# Patient Record
Sex: Male | Born: 1975 | Race: Black or African American | Hispanic: No | Marital: Single | Smoking: Current every day smoker
Health system: Southern US, Community
[De-identification: ages and names within clinical notes are randomized; demographics above are authoritative.]

## PROBLEM LIST (undated history)

## (undated) DIAGNOSIS — M545 Low back pain, unspecified: Secondary | ICD-10-CM

## (undated) DIAGNOSIS — I10 Essential (primary) hypertension: Secondary | ICD-10-CM

## (undated) DIAGNOSIS — E119 Type 2 diabetes mellitus without complications: Secondary | ICD-10-CM

## (undated) DIAGNOSIS — R519 Headache, unspecified: Secondary | ICD-10-CM

## (undated) DIAGNOSIS — R51 Headache: Secondary | ICD-10-CM

## (undated) DIAGNOSIS — G8929 Other chronic pain: Secondary | ICD-10-CM

## (undated) DIAGNOSIS — T7840XA Allergy, unspecified, initial encounter: Secondary | ICD-10-CM

## (undated) DIAGNOSIS — M5430 Sciatica, unspecified side: Secondary | ICD-10-CM

## (undated) HISTORY — DX: Allergy, unspecified, initial encounter: T78.40XA

## (undated) HISTORY — PX: CHOLECYSTECTOMY: SHX55

---

## 2002-07-09 ENCOUNTER — Emergency Department (HOSPITAL_COMMUNITY): Admission: EM | Admit: 2002-07-09 | Discharge: 2002-07-10 | Payer: Self-pay | Admitting: Emergency Medicine

## 2004-09-02 ENCOUNTER — Ambulatory Visit (HOSPITAL_COMMUNITY): Admission: RE | Admit: 2004-09-02 | Discharge: 2004-09-02 | Payer: Self-pay | Admitting: Internal Medicine

## 2005-07-05 ENCOUNTER — Emergency Department (HOSPITAL_COMMUNITY): Admission: EM | Admit: 2005-07-05 | Discharge: 2005-07-05 | Payer: Self-pay | Admitting: Emergency Medicine

## 2005-07-11 ENCOUNTER — Emergency Department (HOSPITAL_COMMUNITY): Admission: EM | Admit: 2005-07-11 | Discharge: 2005-07-11 | Payer: Self-pay | Admitting: Emergency Medicine

## 2013-02-07 ENCOUNTER — Emergency Department (HOSPITAL_COMMUNITY)
Admission: EM | Admit: 2013-02-07 | Discharge: 2013-02-07 | Disposition: A | Discharge status: Home / Self Care | Payer: Self-pay | Attending: Emergency Medicine | Admitting: Emergency Medicine

## 2013-02-07 ENCOUNTER — Encounter (HOSPITAL_COMMUNITY): Payer: Self-pay | Admitting: Emergency Medicine

## 2013-02-07 DIAGNOSIS — F17200 Nicotine dependence, unspecified, uncomplicated: Secondary | ICD-10-CM | POA: Insufficient documentation

## 2013-02-07 DIAGNOSIS — H53149 Visual discomfort, unspecified: Secondary | ICD-10-CM | POA: Insufficient documentation

## 2013-02-07 DIAGNOSIS — R51 Headache: Secondary | ICD-10-CM | POA: Insufficient documentation

## 2013-02-07 MED ORDER — DIPHENHYDRAMINE HCL 50 MG/ML IJ SOLN
25.0000 mg | Freq: Once | INTRAMUSCULAR | Status: AC
  Administered 2013-02-07: 25 mg via INTRAMUSCULAR
  Filled 2013-02-07: qty 1

## 2013-02-07 MED ORDER — METOCLOPRAMIDE HCL 5 MG/ML IJ SOLN
10.0000 mg | Freq: Once | INTRAMUSCULAR | Status: AC
  Administered 2013-02-07: 10 mg via INTRAMUSCULAR
  Filled 2013-02-07: qty 2

## 2013-02-07 MED ORDER — KETOROLAC TROMETHAMINE 30 MG/ML IJ SOLN
30.0000 mg | Freq: Once | INTRAMUSCULAR | Status: AC
  Administered 2013-02-07: 30 mg via INTRAVENOUS
  Filled 2013-02-07: qty 1

## 2013-02-07 NOTE — ED Notes (Signed)
Pt states that he has had a headache off and on for several months now; pt states that his current headache has been consistent for approx 1 week;  Pt denies visual changes; pt c/o joint pain off and on as well; pt denies photophobia from headache

## 2013-02-07 NOTE — ED Provider Notes (Signed)
CSN: [id number]     Arrival date & time 02/07/13  0025 History   First MD Initiated Contact with Patient 02/07/13 0041     Chief Complaint  Patient presents with  . Migraine   (Consider location/radiation/quality/duration/timing/severity/associated sxs/prior Treatment) HPI Comments: Patient is a 37 y/o male with no significant PMH who presents for a frontal headache that has been sporadic over the past month. Patient states that pain is between his eyes and gradual in onset. It is associated with photophobia and relieved often times with PO tylenol. Patient endorses most recent headache beginning before bed this evening. He denies associated fever, difficulty speaking or swallowing, phonophobia, tinnitus, neck pain/stiffness, CP, SOB, numbness/tingling, and extremity weakness. Patient denies a hx of migraine headaches. Endorses increased stress at work and decreased sleep lately.  Patient is a 37 y.o. male presenting with migraines. The history is provided by the patient. No language interpreter was used.  Migraine Associated symptoms include headaches.    History reviewed. No pertinent past medical history. Past Surgical History  Procedure Laterality Date  . Cholecystectomy     No family history on file. History  Substance Use Topics  . Smoking status: Current Every Day Smoker -- 0.25 packs/day  . Smokeless tobacco: Not on file  . Alcohol Use: No    Review of Systems  Eyes: Positive for photophobia.  Neurological: Positive for headaches.  All other systems reviewed and are negative.    Allergies  Review of patient's allergies indicates no known allergies.  Home Medications   Current Outpatient Rx  Name  Route  Sig  Dispense  Refill  . acetaminophen (TYLENOL) 500 MG tablet   Oral   Take 1,000 mg by mouth every 6 (six) hours as needed for pain.          BP 124/84  Pulse 92  Temp(Src) 98 F (36.7 C) (Oral)  Resp 20  Ht 6' (1.829 m)  Wt 175 lb (79.379 kg)  BMI  23.73 kg/m2  SpO2 98%  Physical Exam  Nursing note and vitals reviewed. Constitutional: He is oriented to person, place, and time. He appears well-developed and well-nourished. No distress.  HENT:  Head: Normocephalic and atraumatic.  Mouth/Throat: Oropharynx is clear and moist. No oropharyngeal exudate.  Eyes: Conjunctivae and EOM are normal. No scleral icterus.  Neck: Normal range of motion. Neck supple.  No meningismus or nuchal rigidity.  Cardiovascular: Normal rate, regular rhythm and normal heart sounds.   Pulmonary/Chest: Effort normal and breath sounds normal. No respiratory distress. He has no wheezes. He has no rales.  Musculoskeletal: Normal range of motion.  Neurological: He is alert and oriented to person, place, and time. He has normal strength and normal reflexes. No cranial nerve deficit or sensory deficit. GCS eye subscore is 4. GCS verbal subscore is 5. GCS motor subscore is 6.  GCS 15. Patient speaks in focal oriented sentences. No focal neurologic deficits appreciated. Patient moves extremities without ataxia. Normal strength and sensation. DTRs normal and symmetric.  Skin: Skin is warm and dry. No rash noted. He is not diaphoretic. No erythema. No pallor.  Psychiatric: He has a normal mood and affect. His behavior is normal.    ED Course  Procedures (including critical care time) Labs Review Labs Reviewed - No data to display Imaging Review No results found.  EKG Interpretation   None       MDM   1. Headache    Patient presents for a headache which began before bed  this evening. He states he has been having headaches sporadically for the past month which are relieved by Tylenol. He states that he has been under marked stress lately and has been having them are regular sleep schedule. Symptoms associated only with photophobia. Patient well and nontoxic appearing today as well as hemodynamically stable and afebrile. Patient without meningismus or nuchal  rigidity and neuro exam is nonfocal. No history of head trauma or injury. Will treat patient with Toradol, Reglan, and Benadryl and reassess.  Patient endorses complete resolution with migraine cocktail. Suspect headache to be environmental in origin given lack of sleep and recent stress at home and work. Do not believe emergent imaging is indicated at this time. Patient stable and appropriate for discharge home with primary care followup as needed. Return precautions discussed and patient agreeable to plan with no unaddressed concerns.    Antony Madura, PA-C 02/07/13 [phone]

## 2013-02-07 NOTE — ED Provider Notes (Signed)
Medical screening examination/treatment/procedure(s) were performed by non-physician practitioner and as supervising physician I was immediately available for consultation/collaboration.   Markevion Lattin, MD 02/07/13 0514 

## 2013-05-29 ENCOUNTER — Emergency Department (HOSPITAL_COMMUNITY)
Admission: EM | Admit: 2013-05-29 | Discharge: 2013-05-30 | Disposition: A | Discharge status: Home / Self Care | Payer: Self-pay | Attending: Emergency Medicine | Admitting: Emergency Medicine

## 2013-05-29 ENCOUNTER — Encounter (HOSPITAL_COMMUNITY): Payer: Self-pay | Admitting: Emergency Medicine

## 2013-05-29 DIAGNOSIS — Z87891 Personal history of nicotine dependence: Secondary | ICD-10-CM | POA: Insufficient documentation

## 2013-05-29 DIAGNOSIS — K625 Hemorrhage of anus and rectum: Secondary | ICD-10-CM | POA: Insufficient documentation

## 2013-05-29 DIAGNOSIS — K5900 Constipation, unspecified: Secondary | ICD-10-CM | POA: Insufficient documentation

## 2013-05-29 DIAGNOSIS — R51 Headache: Secondary | ICD-10-CM | POA: Insufficient documentation

## 2013-05-29 DIAGNOSIS — J310 Chronic rhinitis: Secondary | ICD-10-CM | POA: Insufficient documentation

## 2013-05-29 DIAGNOSIS — R5381 Other malaise: Secondary | ICD-10-CM | POA: Insufficient documentation

## 2013-05-29 DIAGNOSIS — H53149 Visual discomfort, unspecified: Secondary | ICD-10-CM | POA: Insufficient documentation

## 2013-05-29 DIAGNOSIS — J3489 Other specified disorders of nose and nasal sinuses: Secondary | ICD-10-CM | POA: Insufficient documentation

## 2013-05-29 DIAGNOSIS — R5383 Other fatigue: Secondary | ICD-10-CM

## 2013-05-29 DIAGNOSIS — R739 Hyperglycemia, unspecified: Secondary | ICD-10-CM

## 2013-05-29 DIAGNOSIS — R519 Headache, unspecified: Secondary | ICD-10-CM

## 2013-05-29 DIAGNOSIS — R7309 Other abnormal glucose: Secondary | ICD-10-CM | POA: Insufficient documentation

## 2013-05-29 DIAGNOSIS — R209 Unspecified disturbances of skin sensation: Secondary | ICD-10-CM | POA: Insufficient documentation

## 2013-05-29 LAB — CBC WITH DIFFERENTIAL/PLATELET
Basophils Absolute: 0 K/uL (ref 0.0–0.1)
Basophils Relative: 0 % (ref 0–1)
Eosinophils Absolute: 1.5 K/uL — ABNORMAL HIGH (ref 0.0–0.7)
Eosinophils Relative: 13 % — ABNORMAL HIGH (ref 0–5)
HCT: 42.2 % (ref 39.0–52.0)
Hemoglobin: 14.2 g/dL (ref 13.0–17.0)
Lymphocytes Relative: 30 % (ref 12–46)
Lymphs Abs: 3.5 K/uL (ref 0.7–4.0)
MCH: 26.4 pg (ref 26.0–34.0)
MCHC: 33.6 g/dL (ref 30.0–36.0)
MCV: 78.6 fL (ref 78.0–100.0)
Monocytes Absolute: 0.9 10*3/uL (ref 0.1–1.0)
Monocytes Relative: 8 % (ref 3–12)
Neutro Abs: 5.8 10*3/uL (ref 1.7–7.7)
Neutrophils Relative %: 49 % (ref 43–77)
Platelets: 364 K/uL (ref 150–400)
RBC: 5.37 MIL/uL (ref 4.22–5.81)
RDW: 13.5 % (ref 11.5–15.5)
WBC: 11.7 10*3/uL — ABNORMAL HIGH (ref 4.0–10.5)

## 2013-05-29 LAB — BASIC METABOLIC PANEL
BUN: 13 mg/dL (ref 6–23)
CO2: 28 mEq/L (ref 19–32)
Calcium: 10.5 mg/dL (ref 8.4–10.5)
Chloride: 92 mEq/L — ABNORMAL LOW (ref 96–112)
Creatinine, Ser: 0.69 mg/dL (ref 0.50–1.35)
GFR calc non Af Amer: >90 mL/min (ref >90)
Glucose, Bld: 232 mg/dL — ABNORMAL HIGH (ref 70–99)
Potassium: 4.1 mEq/L (ref 3.7–5.3)
Sodium: 134 mEq/L — ABNORMAL LOW (ref 137–147)

## 2013-05-29 LAB — CBG MONITORING, ED: Glucose-Capillary: 117 mg/dL — ABNORMAL HIGH (ref 70–99)

## 2013-05-29 LAB — BASIC METABOLIC PANEL WITH GFR: GFR calc Af Amer: >90 mL/min (ref >90)

## 2013-05-29 LAB — LIPASE, BLOOD: LIPASE: 28 U/L (ref 11–59)

## 2013-05-29 MED ORDER — MOMETASONE FUROATE 50 MCG/ACT NA SUSP: 2.0000 | Freq: Every day | NASAL | Status: DC

## 2013-05-29 MED ORDER — DIPHENHYDRAMINE HCL 50 MG/ML IJ SOLN
12.5000 mg | Freq: Once | INTRAMUSCULAR | Status: AC
  Administered 2013-05-29: 12.5 mg via INTRAVENOUS
  Filled 2013-05-29: qty 1

## 2013-05-29 MED ORDER — POLYETHYLENE GLYCOL 3350 17 GM/SCOOP PO POWD: 17.0000 g | Freq: Two times a day (BID) | ORAL | Status: DC

## 2013-05-29 MED ORDER — SODIUM CHLORIDE 0.9 % IV BOLUS (SEPSIS)
1000.0000 mL | Freq: Once | INTRAVENOUS | Status: AC
  Administered 2013-05-29: 1000 mL via INTRAVENOUS

## 2013-05-29 MED ORDER — KETOROLAC TROMETHAMINE 15 MG/ML IJ SOLN
30.0000 mg | Freq: Once | INTRAMUSCULAR | Status: AC
  Administered 2013-05-29: 30 mg via INTRAVENOUS
  Filled 2013-05-29: qty 2

## 2013-05-29 MED ORDER — METOCLOPRAMIDE HCL 5 MG/ML IJ SOLN
10.0000 mg | Freq: Once | INTRAMUSCULAR | Status: AC
  Administered 2013-05-29: 10 mg via INTRAVENOUS
  Filled 2013-05-29: qty 2

## 2013-05-29 MED ORDER — DEXAMETHASONE SODIUM PHOSPHATE 10 MG/ML IJ SOLN
10.0000 mg | Freq: Once | INTRAMUSCULAR | Status: AC
  Administered 2013-05-29: 10 mg via INTRAVENOUS
  Filled 2013-05-29: qty 1

## 2013-05-29 NOTE — ED Notes (Signed)
Pt presents with c/o headache that started a week ago. Pt says he has had the headache off and on for the past week, has been taking over the counter medication as needed. Pt reports light sensitivity. Also c/o constipation and hard formed stools when he has a bowel movement, experiencing some abdominal pain with it as well.

## 2013-05-29 NOTE — ED Notes (Signed)
Pt c/o pain to face just above nose. Pt states his pain gets worse when he has a BM and then his legs get weak also. Pt states he has had some nausea and just does not feel comfortable right now. Pt a/o x 4.

## 2013-05-29 NOTE — Discharge Instructions (Signed)
Follow up with [facility] for follow up on your hyperglycemia. Take miralax as directed for your constipation. Use Nasonex nasal spray as directed for your Rhinitis. Recommend OTC antihistamines (zyrtec) for additional allergy related symptoms (sneezing, runny nose, itchy/watery eyes). Resource guide below for follow up reference.    Emergency Department Resource Guide 1) Find a Doctor and Pay Out of Pocket Although you won't have to find out who is covered by your insurance plan, it is a good idea to ask around and get recommendations. You will then need to call the office and see if the doctor you have chosen will accept you as a new patient and what types of options they offer for patients who are self-pay. Some doctors offer discounts or will set up payment plans for their patients who do not have insurance, but you will need to ask so you aren't surprised when you get to your appointment.  2) Contact Your Local Health Department Not all health departments have doctors that can see patients for sick visits, but many do, so it is worth a call to see if yours does. If you don't know where your local health department is, you can check in your phone book. The CDC also has a tool to help you locate your state's health department, and many state websites also have listings of all of their local health departments.  3) Find a Walk-in Clinic If your illness is not likely to be very severe or complicated, you may want to try a walk in clinic. These are popping up all over the country in pharmacies, drugstores, and shopping centers. They're usually staffed by nurse practitioners or physician assistants that have been trained to treat common illnesses and complaints. They're usually fairly quick and inexpensive. However, if you have serious medical issues or chronic medical problems, these are probably not your best option.  No Primary Care Doctor: - Call Health Connect at   [phone] - they can help you locate a primary care doctor that  accepts your insurance, provides certain services, etc. - Physician Referral Service- [phone]  Chronic Pain Problems: Organization         Address  Phone   Notes  [facility]  [phone] Patients need to be referred by their primary care doctor.   Medication Assistance: Organization         Address  Phone   Notes  [facility] Medication [facility] [address] [place], [place] [address] [phone] --Must be a resident of [facility] -- Must have NO insurance coverage whatsoever (no Medicaid/ Medicare, etc.) -- The pt. MUST have a primary care doctor that directs their care regularly and follows them in the community   Med[facility]ist  [phone]   [employer]  [phone]    Agencies that provide inexpensive medical care: Organization         Address  Phone   Notes  [facility]  [phone]   [facility]    [phone]   [facility] [address] [place], [place] [address] [phone]   [facility] [address] [place], [place] [phone]   Planned Parenthood    [phone]   [facility]    [phone]   [facility] and [facility]  [address], [place] Phone:  [phone], Fax:  [phone] Hours of  Operation:  9 am - 6 pm, M-F.  Also accepts Medicaid/Medicare and self-pay.  [facility]  [address], [place] Phone: [phone], Fax: [phone]. Hours of Operation:  8:30 am - 5:30 pm, M-F.  Also accepts Medicaid and self-pay.  [facility] [place] [address], [place] Phone: [phone]   [facility] [address], [place], [place] [phone], Ext. [phone] Mondays & Thursdays: 7-9 AM.  First 15 patients are seen on a first come, first serve basis.     Medicaid-accepting [facility] Providers:  Organization         Address  Phone   Notes  [facility] [address], [place] [phone] Also accepts self-pay patients.  [facility] [address], [place]  [phone]) [phone]   [facility] [address], [place] [phone]) [phone]   [facility] [address] [place] [address], [place] [phone]) [phone]   Renaye RakersVeita Bland [address], [place]   [phone] Only accepts [place] Access [place]icaid patients after they have their name applied to their card.   Self-Pay (no insurance) in [facility]:  Organization         Address  Phone   Notes  Sickle Cell Patients, [facility] [address], [place] [phone]) [phone]   [facility] [address], [place] [phone]) [phone]   [facility]  [address], [place] [phone]   [facility]/Dr. Osei-Bonsu  [address] [place] [address], [place] or [phone], [place] [phone]) [phone] Phone number for both [place] and [place] locations is the same.  [facility] [address], [place] [phone]) [phone]   [facility] [address] [place] [address], [place] or [address] [phone]   [facility] [address], [place] [phone], phone; [phone](336) [phone], fax Sees patients 1st and 3rd Saturday of every month.  Must not qualify for public or private insurance (i.e. Medicaid, Medicare, [place] Health Choice, Veterans' Benefits)  Household income should be no more than 200% of the poverty level The clinic cannot treat you if you are pregnant or think you are pregnant  Sexually transmitted diseases are not treated at the clinic.    Dental Care: Organization         Address  Phone  Notes  [facility]  Department of [facility] [address], [place] [phone] Accepts children up to age 36 who are enrolled in [place]icaid or [place] Health Choice; pregnant women with a Medicaid card; and children who have applied for Medicaid or [place] Health Choice, but were declined, whose parents can pay a reduced fee at time of service.  [facility] Department of [facility] [place]  [address], [place] [phone] Accepts children up to age 36 who are enrolled in [place]icaid or [place] Health Choice; pregnant women with a Medicaid card; and children who have applied for Medicaid or [employer] Health Choice, but were declined, whose parents can pay a reduced fee at time of service.  [facility]  [address], [place] [phone]) [phone] Patients are seen by appointment only. Walk-ins are not accepted. [facility] will see patients  36 years of age and older. Monday - Tuesday (8am-5pm) Most Wednesdays (8:30-5pm) $30 per visit, cash only  [facility]  [address], [facility] [place] [phone]) [phone] Patients are seen by appointment only. Walk-ins are not accepted. [facility] will see patients 36 years of age and older. One Wednesday Evening (Monthly: Volunteer Based).  $30 per visit, cash only  [employer] [facility]  [phone] for adults; Children under age 764, call Graduate Pediatric Dentistry at [phone]. Children aged 874-14, please call [phone] to request a pediatric application.  Dental services are provided in all areas of dental care including fillings, crowns and bridges, complete and partial dentures, implants, gum treatment, root canals, and extractions. Preventive care is also provided. Treatment is provided to both adults and children. Patients are selected via a lottery and there is often a waiting list.   [facility] [address], [place]  [phone]  [email or web address]   [facility] [address], [place], [place] [phone], Ext. [phone] Second and Fourth Thursday of each month, opens at 6:30 AM; Clinic ends at 9 AM.  Patients are seen on a first-come first-served basis, and a limited number are seen during each clinic.   [facility]  [address], [place], [place] [phone]) [phone]   Eligibility Requirements You must have lived in [place], [place], or [place] counties for at least the last three months.   You cannot be eligible for state or federal sponsored National Cityhealthcare insurance, including CIGNAVeterans Administration, [place]icaid, or [employer]Medicare.   You generally cannot be eligible for healthcare insurance through your employer.    How to apply: Eligibility screenings are held every Tuesday and Wednesday afternoon from 1:00 pm until 4:00 pm. You do not need an appointment for the interview!  [facility] [address], [place], [place] [phone]   [facility]  [phone]   [facility]  [phone]   [facility]  [phone]    Behavioral Health Resources in the Community: Intensive Outpatient Programs Organization         Address  Phone  Notes  [facility] [address], [place], [place] [phone]   [facility] [address], [place], [place] [phone]   [facility] [address], [place], [place]  [phone]   [facility] [address],  [place], [place] [phone] or [phone]   Substance Abuse Resources Organization         Address  Phone  Notes  Al[facility] Drug Services  [phone]   [facility]  [phone]   [facility]  [phone]   [facility]  [phone]   Residential & Outpatient Substance Abuse Program  [phone]   Psychological Services Organization          Address  Phone  Notes  [facility]  [phone]- [phone]   [facility]  [phone]- [phone]   [facility] [address], [place] [phone] or [phone]    Mobile Crisis Teams Organization         Address  Phone  Notes  [facility], Mobile Crisis Care Unit  [phone]   [facility]  [address] [place], [place] [phone]   [facility] [address] [place] [phone]    Self-Help/Support Groups Organization  Address  Phone             Notes  [place]. of [place] - variety of support groups  [place] Call for more information  Narcotics Anonymous (NA), [facility] [address], [employer] Ruth  2 meetings at this location   Special educational needs teacher         Address  Phone  Notes  ASAP Residential Treatment [place],    [place]  [phone]   [facility]  [address] [place] [address]1638, [place], [place]   [place], [place] [phone] Admissions: 8am-3pm M-F  Incentives Substance [place] [address]    [place], [place] [phone]   [facility] [address] [place], [place], [place]   [facility] [address]  [place], [place]   Insight Programs - Intensive Outpatient [address], [place], [place]   [facility] ([address].,  [place], [place] [phone] or [phone]   [facility] (RTS) [address] [place], [place] Accepts Medicaid  [facility] [address],  [place] [phone] Substance Abuse/Addiction Treatment   [facility] Organization         Address  Phone  Notes  [facility]  [phone]   Domenic Schwab, PhD [address], [place]   [phone] or [phone]   [facility] [place] [facility], [place] [phone]   [facility] [address], [place], [place] [phone] Insurance/Medicaid/sponsorship through [facility] and Families [address], [address]                                    [place], [place] [phone] [place] [address], [place] [phone]    Dr. Adele Schilder  [phone]   [facility] Dept. 1) [address] [place] 2) [place] 3)  [place] [address], [place] [phone]  [phone]   [place] [phone] or [phone] (After Hours)

## 2013-05-29 NOTE — ED Provider Notes (Signed)
CSN: [id number]     Arrival date & time 05/29/13  1432 History   First MD Initiated Contact with Patient 05/29/13 1915     Chief Complaint  Patient presents with  . Headache  . Abdominal Pain     (Consider location/radiation/quality/duration/timing/severity/associated sxs/prior Treatment) Patient is a 38 y.o. male presenting with headaches and abdominal pain.  Headache Associated symptoms: abdominal pain, congestion and sinus pressure   Abdominal Pain  38 yo male presents with HA and RLQ pain that started 1 week ago. RLQ abdominal pain is rated at 5/10 and is constant. Pain worsened by eating. Pain improved with bowel movements. HA is intermittent and lasts 2-3 hours at a time. HA usually worse in the morning and the evening. Pain improved with tylenol but only a couple of hours and then HA returns. Pain rated at 9/10.Pain localized between the eyes.  Patient admits to similar episode that happened 3 months ago. Otherwise patient denies hx of HAs. Denies any family hx of migraines. HA worsened "2-3 hours after I go to the bathroom" (bowel movements). Admits to photophobia and phonophobia.  Patient admits to dizziness when he doesn't eat but resolves when he eats.  Denies fever/chills, N/V/D, denies any urinary symptoms. Admits to occasional dark black stools, constipation, and weakness. Patient states he experiences lower leg "weakness" and "tingling" about 2 hours after his BMs.    History reviewed. No pertinent past medical history. Past Surgical History  Procedure Laterality Date  . Cholecystectomy     No family history on file. History  Substance Use Topics  . Smoking status: Former Smoker -- 0.25 packs/day  . Smokeless tobacco: Not on file  . Alcohol Use: No    Review of Systems  HENT: Positive for congestion, sinus pressure and sneezing.   Gastrointestinal: Positive for abdominal pain and anal bleeding.  All other systems reviewed and are negative.      Allergies  Review  of patient's allergies indicates no known allergies.  Home Medications   Current Outpatient Rx  Name  Route  Sig  Dispense  Refill  . acetaminophen (TYLENOL) 500 MG tablet   Oral   Take 1,000 mg by mouth every 6 (six) hours as needed (pain).          . Ibuprofen-Diphenhydramine HCl (ADVIL PM) 200-25 MG CAPS   Oral   Take 1 capsule by mouth at bedtime as needed (flu-like symptoms).         . mometasone (NASONEX) 50 MCG/ACT nasal spray   Nasal   Place 2 sprays into the nose daily.   17 g   1   . polyethylene glycol powder (GLYCOLAX/MIRALAX) powder   Oral   Take 17 g by mouth 2 (two) times daily. Until daily soft stools  OTC   255 g   0    BP 123/78  Pulse 89  Temp(Src) 98 F (36.7 C) (Oral)  Resp 16  SpO2 99% Physical Exam  Nursing note and vitals reviewed. Constitutional: He is oriented to person, place, and time. He appears well-developed and well-nourished. No distress.  HENT:  Head: Normocephalic and atraumatic.  Right Ear: Tympanic membrane and ear canal normal.  Left Ear: Tympanic membrane and ear canal normal.  Nose: Mucosal edema and rhinorrhea present. Right sinus exhibits frontal sinus tenderness. Right sinus exhibits no maxillary sinus tenderness. Left sinus exhibits frontal sinus tenderness. Left sinus exhibits no maxillary sinus tenderness.  Mouth/Throat: Uvula is midline, oropharynx is clear and moist and mucous membranes  are normal.  Eyes: Conjunctivae and EOM are normal. Pupils are equal, round, and reactive to light. No scleral icterus.  Neck: Trachea normal and phonation normal. Neck supple. No JVD present. Carotid bruit is not present. No tracheal deviation present.  Cardiovascular: Normal rate and regular rhythm.  Exam reveals no gallop and no friction rub.   No murmur heard. Pulmonary/Chest: Effort normal and breath sounds normal. No respiratory distress. He has no wheezes. He has no rhonchi. He has no rales.  Abdominal: Soft. Bowel sounds are  normal. He exhibits no distension. There is no hepatosplenomegaly. There is tenderness in the right lower quadrant and left lower quadrant. There is no rigidity, no rebound, no guarding, no tenderness at McBurney's point and negative Murphy's sign.  Genitourinary: Rectum normal. Rectal exam shows no external hemorrhoid, no internal hemorrhoid, no fissure, no mass, no tenderness and anal tone normal. Guaiac negative stool.  Musculoskeletal: Normal range of motion. He exhibits no edema.  Neurological: He is alert and oriented to person, place, and time. He has normal strength. No cranial nerve deficit or sensory deficit.  CN II-XII grossly intact. Cerebellar function appears intact with finger to nose.   Skin: Skin is warm and dry. He is not diaphoretic.  Psychiatric: He has a normal mood and affect. His behavior is normal.    ED Course  Procedures (including critical care time) Labs Review Labs Reviewed  CBC WITH DIFFERENTIAL - Abnormal; Notable for the following:    WBC 11.7 (*)    Eosinophils Relative 13 (*)    Eosinophils Absolute 1.5 (*)    All other components within normal limits  BASIC METABOLIC PANEL - Abnormal; Notable for the following:    Sodium 134 (*)    Chloride 92 (*)    Glucose, Bld 232 (*)    All other components within normal limits  CBG MONITORING, ED - Abnormal; Notable for the following:    Glucose-Capillary 117 (*)    All other components within normal limits  LIPASE, BLOOD   Imaging Review No results found.  EKG Interpretation   None       MDM   Final diagnoses:  Headache  Sinus pressure  Hyperglycemia  Constipation  Rhinitis    Patient afebrile.  Mild Leukocytosis at 11.7 with eosinophilia. Suspect allergies given patient congestion, Rhinitis and sinus tenderness.  Hyperglycemia at 232. Improved with IV fluids to 117 Lipase negative Hemoccult negative Patient's HA and abdominal pain resolved with Tx in ED. Reexamination  reveals soft  nontender abdomen.   Discussed labs, and exam findings with patient. Advised follow up with PCP for further evaluation of Hyperglycemia. Patient provided resource guide for reference. Recommend return to ED should symptoms worsen. Patient agrees with plan. Discharged in good condition.   Meds given in ED:  Medications  sodium chloride 0.9 % bolus 1,000 mL (0 mLs Intravenous Stopped 05/29/13 2313)  diphenhydrAMINE (BENADRYL) injection 12.5 mg (12.5 mg Intravenous Given 05/29/13 2131)  metoCLOPramide (REGLAN) injection 10 mg (10 mg Intravenous Given 05/29/13 2126)  ketorolac (TORADOL) 15 MG/ML injection 30 mg (30 mg Intravenous Given 05/29/13 2129)  dexamethasone (DECADRON) injection 10 mg (10 mg Intravenous Given 05/29/13 2312)    Discharge Medication List as of 05/29/2013 11:07 PM        Rudene Anda, PA-C 05/30/13 1302

## 2013-05-29 NOTE — ED Notes (Signed)
Tried to get a urine sample from the pt, he stated that he didn't have to use the restroom. Will ask again later

## 2013-06-02 NOTE — ED Provider Notes (Signed)
Medical screening examination/treatment/procedure(s) were performed by non-physician practitioner and as supervising physician I was immediately available for consultation/collaboration.  EKG Interpretation  None    Gavin PoundMichael Y. Breeonna Mone, MD 06/02/13 1044

## 2013-06-28 ENCOUNTER — Ambulatory Visit: Payer: Self-pay | Admitting: Physician Assistant

## 2013-06-28 VITALS — BP 126/78 | HR 102 | Temp 97.7°F | Resp 18 | Ht 72.0 in | Wt 184.8 lb

## 2013-06-28 DIAGNOSIS — J329 Chronic sinusitis, unspecified: Secondary | ICD-10-CM

## 2013-06-28 DIAGNOSIS — R51 Headache: Secondary | ICD-10-CM

## 2013-06-28 DIAGNOSIS — Z131 Encounter for screening for diabetes mellitus: Secondary | ICD-10-CM

## 2013-06-28 DIAGNOSIS — E119 Type 2 diabetes mellitus without complications: Secondary | ICD-10-CM

## 2013-06-28 LAB — POCT GLYCOSYLATED HEMOGLOBIN (HGB A1C): Hemoglobin A1C: 9.6

## 2013-06-28 LAB — GLUCOSE, POCT (MANUAL RESULT ENTRY): POC Glucose: 264 mg/dl — AB (ref 70–99)

## 2013-06-28 MED ORDER — METFORMIN HCL 500 MG PO TABS: 500.0000 mg | ORAL_TABLET | Freq: Every day | ORAL | Status: DC

## 2013-06-28 MED ORDER — FLUTICASONE PROPIONATE 50 MCG/ACT NA SUSP: 2.0000 | Freq: Every day | NASAL | Status: DC

## 2013-06-28 MED ORDER — IBUPROFEN 600 MG PO TABS: 600.0000 mg | ORAL_TABLET | Freq: Three times a day (TID) | ORAL | Status: DC | PRN

## 2013-06-28 MED ORDER — AMOXICILLIN-POT CLAVULANATE 875-125 MG PO TABS: 1.0000 | ORAL_TABLET | Freq: Two times a day (BID) | ORAL | Status: DC

## 2013-06-28 MED ORDER — KETOROLAC TROMETHAMINE 60 MG/2ML IM SOLN
60.0000 mg | Freq: Once | INTRAMUSCULAR | Status: AC
  Administered 2013-06-28: 60 mg via INTRAMUSCULAR

## 2013-06-28 NOTE — Progress Notes (Signed)
Subjective:    Patient ID: Ian Moreno, male    DOB: 11/19/1975, 38 y.o.   MRN: [id number]  HPI   Ian Moreno is a pleasant 38 yr old male here with concern for "sinus."  This has been present off and on x 1 month.  He has a frontal HA that is bilateral, sometimes band-like.  He has taken tylenol for this.  He was seen for this at the ED about 1 month ago, had improvement with Toradol and steroids.  He has Flonase but uses it inconsistently.  He has not had fever.  He occasionally feels dizzy.  No visual change.  He denies sneezing or runny nose.  Additionally he has concern for diabetes.  Someone has mentioned to him before that he should only have diet drinks because of his sugar.  On chart review, glucose elevated at ED visit 1 month ago.  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  He has gained 15 lbs since stopped smoking.   Review of Systems  Constitutional: Negative for fever and chills.  HENT: Positive for congestion and sinus pressure. Negative for rhinorrhea and sneezing.   Respiratory: Negative for cough, shortness of breath and wheezing.   Cardiovascular: Negative.   Gastrointestinal: Negative.   Endocrine: Negative for polydipsia, polyphagia and polyuria.  Musculoskeletal: Negative.   Skin: Negative.   Neurological: Positive for headaches.       Objective:   Physical Exam  Vitals reviewed. Constitutional: He is oriented to person, place, and time. He appears well-developed and well-nourished. No distress.  HENT:  Head: Normocephalic and atraumatic.  Right Ear: Tympanic membrane and ear canal normal.  Left Ear: Tympanic membrane and ear canal normal.  Nose: Right sinus exhibits frontal sinus tenderness. Right sinus exhibits no maxillary sinus tenderness. Left sinus exhibits frontal sinus tenderness. Left sinus exhibits no maxillary sinus tenderness.  Mouth/Throat: Uvula is midline, oropharynx is clear and moist and mucous membranes are normal.    Eyes: Conjunctivae are normal. No scleral icterus.  Neck: Neck supple.  Cardiovascular: Normal rate, regular rhythm and normal heart sounds.   Pulmonary/Chest: Effort normal and breath sounds normal. He has no wheezes. He has no rales.  Lymphadenopathy:    He has no cervical adenopathy.  Neurological: He is alert and oriented to person, place, and time.  Skin: Skin is warm and dry.  Psychiatric: He has a normal mood and affect. His behavior is normal.    Results for orders placed in visit on 06/28/13  GLUCOSE, POCT (MANUAL RESULT ENTRY)      Result Value Ref Range   POC Glucose 264 (*) 70 - 99 mg/dl  POCT GLYCOSYLATED HEMOGLOBIN (HGB A1C)      Result Value Ref Range   Hemoglobin A1C 9.6         Assessment & Plan:  Sinusitis - Plan: fluticasone (FLONASE) 50 MCG/ACT nasal spray, amoxicillin-clavulanate (AUGMENTIN) 875-125 MG per tablet, DISCONTINUED: amoxicillin-clavulanate (AUGMENTIN) 875-125 MG per tablet  Headache(784.0) - Plan: POCT glucose (manual entry), POCT glycosylated hemoglobin (Hb A1C), ketorolac (TORADOL) injection 60 mg, ibuprofen (ADVIL,MOTRIN) 600 MG tablet, DISCONTINUED: ibuprofen (ADVIL,MOTRIN) 600 MG tablet  Diabetes - Plan: metFORMIN (GLUCOPHAGE) 500 MG tablet, DISCONTINUED: metFORMIN (GLUCOPHAGE) 500 MG tablet  Screening for diabetes mellitus - Plan: POCT glucose (manual entry), POCT glycosylated hemoglobin (Hb A1C)   Ian Moreno is a pleasant 38 yr old male here with headache and sinusitis.  Given the chronicity of his sinus  pressure and the tenderness on exam, will treat with augmentin x 10 days.  Instructed him to use Flonase daily rather than prn for best effect.  Toradol in clinic for HA with good relief of symptoms.  I have prescribed ibuprofen 600mg  for him to use prn HA.  I think that his current HA is in part due to tension type headache as well as sinusitis.  We discussed upper back/neck stretches, heat, etc to help ease tension.   Pt requested screening  for DM as he has been told his sugar was high in the past. Today random glucose is 264 and A1C is 9.6 - which meets criteria for DM diagnosis.  I discussed what this means with pt.  Will start metformin 500mg  QD today - encouraged to take with food.  We discussed the possibility of managing DM with lifestyle mods.  Encouraged him to avoid sugar sweetened beverages and printed him meal planning guides.  Additionally I encouraged him to work on incorporating exercise daily.  Will have him schedule ant appt in 2-3 wks for follow up and adjustment of meds if needed.  He understands and agrees and his questions were answered.    Loleta Dicker MHS, PA-C [facility] 3/25/20158:38 PM

## 2013-06-28 NOTE — Patient Instructions (Signed)
The antibiotic prescribed today is for your present infection only. It is very important to follow the directions for the medication prescribed. Antibiotics are generally given for a specified period of time (7-10 days, for example) to be taken at specific intervals (every 4, 6, 8 or 12 hours). This is necessary to keep the right amount of the medication in the bloodstream. Too much of the medication may cause an adverse reaction, too little may not be completely effective.  To clear your infection completely, continue taking the antibiotic for the full time of treatment, even if you begin to feel better after a few days.  If you miss a dose of the antibiotic, take it as soon as possible. Then go back to your regular dosing schedule. However, don't double up doses.    Begin taking the amoxicillin-clavulanate (AugmentIn) as directed.  This is the antibiotic that will treat you for a sinus infection  Use the fluticasone (Flonase) nasal spray once daily every day - this will help your sinus symptoms  Take the ibuprofen 600mg  if you need it for headache  I think some of your headache is caused by tension in your neck and shoulders, so I want you to use a heating pad on your neck and shoulders  Your blood sugar is elevated today and your A1C is also elevated meaning you having diabetes.  It is important that you take the metformin every day with breakfast.  This will help bring your blood sugar down to a healthy level.  Make sure you are not drinking soda, sweet tea, juice, sports or energy drinks as these have LOTs of sugar.  Make sure you are eating plenty of vegetables, fruits, and lean meats.    Make sure you are getting some exercise every day, even if it is just a walk  Please make an appointment in 2-3 weeks so we can recheck you and adjust your medicines if needed   Diabetes Meal Planning Guide The diabetes meal planning guide is a tool to help you plan your meals and snacks. It is important for  people with diabetes to manage their blood glucose (sugar) levels. Choosing the right foods and the right amounts throughout your day will help control your blood glucose. Eating right can even help you improve your blood pressure and reach or maintain a healthy weight. CARBOHYDRATE COUNTING MADE EASY When you eat carbohydrates, they turn to sugar. This raises your blood glucose level. Counting carbohydrates can help you control this level so you feel better. When you plan your meals by counting carbohydrates, you can have more flexibility in what you eat and balance your medicine with your food intake. Carbohydrate counting simply means adding up the total amount of carbohydrate grams in your meals and snacks. Try to eat about the same amount at each meal. Foods with carbohydrates are listed below. Each portion below is 1 carbohydrate serving or 15 grams of carbohydrates. Ask your dietician how many grams of carbohydrates you should eat at each meal or snack. Grains and Starches  1 slice bread.   English muffin or hotdog/hamburger bun.   cup cold cereal (unsweetened).   cup cooked pasta or rice.   cup starchy vegetables (corn, potatoes, peas, beans, winter squash).  1 tortilla (6 inches).   bagel.  1 waffle or pancake (size of a CD).   cup cooked cereal.  4 to 6 small crackers. *Whole grain is recommended. Fruit  1 cup fresh unsweetened berries, melon, papaya, pineapple.  1 small fresh fruit.   banana or mango.   cup fruit juice (4 oz unsweetened).   cup canned fruit in natural juice or water.  2 tbs dried fruit.  12 to 15 grapes or cherries. Milk and Yogurt  1 cup fat-free or 1% milk.  1 cup soy milk.  6 oz light yogurt with sugar-free sweetener.  6 oz low-fat soy yogurt.  6 oz plain yogurt. Vegetables  1 cup raw or  cup cooked is counted as 0 carbohydrates or a "free" food.  If you eat 3 or more servings at 1 meal, count them as 1 carbohydrate  serving. Other Carbohydrates   oz chips or pretzels.   cup ice cream or frozen yogurt.   cup sherbet or sorbet.  2 inch square cake, no frosting.  1 tbs honey, sugar, jam, jelly, or syrup.  2 small cookies.  3 squares of graham crackers.  3 cups popcorn.  6 crackers.  1 cup broth-based soup.  Count 1 cup casserole or other mixed foods as 2 carbohydrate servings.  Foods with less than 20 calories in a serving may be counted as 0 carbohydrates or a "free" food. You may want to purchase a book or computer software that lists the carbohydrate gram counts of different foods. In addition, the nutrition facts panel on the labels of the foods you eat are a good source of this information. The label will tell you how big the serving size is and the total number of carbohydrate grams you will be eating per serving. Divide this number by 15 to obtain the number of carbohydrate servings in a portion. Remember, 1 carbohydrate serving equals 15 grams of carbohydrate. SERVING SIZES Measuring foods and serving sizes helps you make sure you are getting the right amount of food. The list below tells how big or small some common serving sizes are.  1 oz.........4 stacked dice.  3 oz........Marland KitchenDeck of cards.  1 tsp.......Marland KitchenTip of little finger.  1 tbs......Marland KitchenMarland KitchenThumb.  2 tbs.......Marland KitchenGolf ball.   cup......Marland KitchenHalf of a fist.  1 cup.......Marland KitchenA fist. SAMPLE DIABETES MEAL PLAN Below is a sample meal plan that includes foods from the grain and starches, dairy, vegetable, fruit, and meat groups. A dietician can individualize a meal plan to fit your calorie needs and tell you the number of servings needed from each food group. However, controlling the total amount of carbohydrates in your meal or snack is more important than making sure you include all of the food groups at every meal. You may interchange carbohydrate containing foods (dairy, starches, and fruits). The meal plan below is an example of a  2000 calorie diet using carbohydrate counting. This meal plan has 17 carbohydrate servings. Breakfast  1 cup oatmeal (2 carb servings).   cup light yogurt (1 carb serving).  1 cup blueberries (1 carb serving).   cup almonds. Snack  1 large apple (2 carb servings).  1 low-fat string cheese stick. Lunch  Chicken breast salad.  1 cup spinach.   cup chopped tomatoes.  2 oz chicken breast, sliced.  2 tbs low-fat [place] dressing.  12 whole-wheat crackers (2 carb servings).  12 to 15 grapes (1 carb serving).  1 cup low-fat milk (1 carb serving). Snack  1 cup carrots.   cup hummus (1 carb serving). Dinner  3 oz broiled salmon.  1 cup brown rice (3 carb servings). Snack  1  cups steamed broccoli (1 carb serving) drizzled with 1 tsp olive oil and lemon juice.  1 cup light pudding (2 carb servings). DIABETES MEAL PLANNING WORKSHEET Your dietician can use this worksheet to help you decide how many servings of foods and what types of foods are right for you.  BREAKFAST Food Group and Servings / Carb Servings Grain/Starches __________________________________ Dairy __________________________________________ Vegetable ______________________________________ Fruit ___________________________________________ Meat __________________________________________ Fat ____________________________________________ LUNCH Food Group and Servings / Carb Servings Grain/Starches ___________________________________ Dairy ___________________________________________ Fruit ____________________________________________ Meat ___________________________________________ Fat _____________________________________________ Laural Golden Food Group and Servings / Carb Servings Grain/Starches ___________________________________ Dairy ___________________________________________ Fruit ____________________________________________ Meat ___________________________________________ Fat  _____________________________________________ SNACKS Food Group and Servings / Carb Servings Grain/Starches ___________________________________ Dairy ___________________________________________ Vegetable _______________________________________ Fruit ____________________________________________ Meat ___________________________________________ Fat _____________________________________________ DAILY TOTALS Starches _________________________ Vegetable ________________________ Fruit ____________________________ Dairy ____________________________ Meat ____________________________ Fat ______________________________ Document Released: 12/18/2004 Document Revised: 06/15/2011 Document Reviewed: 10/29/2008 [employer] Patient Information 2014 [employer]

## 2013-07-04 ENCOUNTER — Telehealth: Payer: Self-pay

## 2013-07-04 NOTE — Telephone Encounter (Signed)
Spoke to pt, he has been taking the metformin and amoxicillin at the same time in the morning, and then amoxicillin in the afternoon.  He stated he did not take the metformin today.   i advised him to take the metformin prescribed, wait a few hours and then take the first of two amoxicillin. i told him to call back and let us know which of the medications is causing nausea.

## 2013-07-04 NOTE — Telephone Encounter (Signed)
Patient was recently seen and reports that's taking amoxicillan makes him very dizzy and he can not work while on medication.  I offered for him to be seen but he wanted a telephone message instead.   Best: [phone]

## 2013-07-10 ENCOUNTER — Emergency Department (HOSPITAL_COMMUNITY)
Admission: EM | Admit: 2013-07-10 | Discharge: 2013-07-11 | Disposition: A | Discharge status: Home / Self Care | Payer: Self-pay | Attending: Emergency Medicine | Admitting: Emergency Medicine

## 2013-07-10 DIAGNOSIS — E119 Type 2 diabetes mellitus without complications: Secondary | ICD-10-CM | POA: Insufficient documentation

## 2013-07-10 DIAGNOSIS — Z79899 Other long term (current) drug therapy: Secondary | ICD-10-CM | POA: Insufficient documentation

## 2013-07-10 DIAGNOSIS — Z87891 Personal history of nicotine dependence: Secondary | ICD-10-CM | POA: Insufficient documentation

## 2013-07-10 DIAGNOSIS — R51 Headache: Secondary | ICD-10-CM | POA: Insufficient documentation

## 2013-07-10 DIAGNOSIS — IMO0002 Reserved for concepts with insufficient information to code with codable children: Secondary | ICD-10-CM | POA: Insufficient documentation

## 2013-07-10 DIAGNOSIS — I10 Essential (primary) hypertension: Secondary | ICD-10-CM | POA: Insufficient documentation

## 2013-07-10 HISTORY — DX: Essential (primary) hypertension: I10

## 2013-07-10 HISTORY — DX: Type 2 diabetes mellitus without complications: E11.9

## 2013-07-11 ENCOUNTER — Encounter (HOSPITAL_COMMUNITY): Payer: Self-pay | Admitting: Emergency Medicine

## 2013-07-11 LAB — CBG MONITORING, ED: Glucose-Capillary: 152 mg/dL — ABNORMAL HIGH (ref 70–99)

## 2013-07-11 NOTE — Discharge Instructions (Signed)
Arterial Hypertension °Arterial hypertension (high blood pressure) is a condition of elevated pressure in your blood vessels. Hypertension over a long period of time is a risk factor for strokes, heart attacks, and heart failure. It is also the leading cause of kidney (renal) failure.  °CAUSES  °· In Adults -- Over 90% of all hypertension has no known cause. This is called essential or primary hypertension. In the other 10% of people with hypertension, the increase in blood pressure is caused by another disorder. This is called secondary hypertension. Important causes of secondary hypertension are: °· Heavy alcohol use. °· Obstructive sleep apnea. °· Hyperaldosterosim (Conn's syndrome). °· Steroid use. °· Chronic kidney failure. °· Hyperparathyroidism. °· Medications. °· Renal artery stenosis. °· Pheochromocytoma. °· Cushing's disease. °· Coarctation of the aorta. °· Scleroderma renal crisis. °· Licorice (in excessive amounts). °· Drugs (cocaine, methamphetamine). °Your caregiver can explain any items above that apply to you. °· In Children -- Secondary hypertension is more common and should always be considered. °· Pregnancy -- Few women of childbearing age have high blood pressure. However, up to 10% of them develop hypertension of pregnancy. Generally, this will not harm the woman. It may be a sign of 3 complications of pregnancy: preeclampsia, HELLP syndrome, and eclampsia. Follow up and control with medication is necessary. °SYMPTOMS  °· This condition normally does not produce any noticeable symptoms. It is usually found during a routine exam. °· Malignant hypertension is a late problem of high blood pressure. It may have the following symptoms: °· Headaches. °· Blurred vision. °· End-organ damage (this means your kidneys, heart, lungs, and other organs are being damaged). °· Stressful situations can increase the blood pressure. If a person with normal blood pressure has their blood pressure go up while being  seen by their caregiver, this is often termed "white coat hypertension." Its importance is not known. It may be related with eventually developing hypertension or complications of hypertension. °· Hypertension is often confused with mental tension, stress, and anxiety. °DIAGNOSIS  °The diagnosis is made by 3 separate blood pressure measurements. They are taken at least 1 week apart from each other. If there is organ damage from hypertension, the diagnosis may be made without repeat measurements. °Hypertension is usually identified by having blood pressure readings: °· Above 140/90 mmHg measured in both arms, at 3 separate times, over a couple weeks. °· Over 130/80 mmHg should be considered a risk factor and may require treatment in patients with diabetes. °Blood pressure readings over 120/80 mmHg are called "pre-hypertension" even in non-diabetic patients. °To get a true blood pressure measurement, use the following guidelines. Be aware of the factors that can alter blood pressure readings. °· Take measurements at least 1 hour after caffeine. °· Take measurements 30 minutes after smoking and without any stress. This is another reason to quit smoking  it raises your blood pressure. °· Use a proper cuff size. Ask your caregiver if you are not sure about your cuff size. °· Most home blood pressure cuffs are automatic. They will measure systolic and diastolic pressures. The systolic pressure is the pressure reading at the start of sounds. Diastolic pressure is the pressure at which the sounds disappear. If you are elderly, measure pressures in multiple postures. Try sitting, lying or standing. °· Sit at rest for a minimum of 5 minutes before taking measurements. °· You should not be on any medications like decongestants. These are found in many cold medications. °· Record your blood pressure readings and review   them with your caregiver. °If you have hypertension: °· Your caregiver may do tests to be sure you do not have  secondary hypertension (see "causes" above). °· Your caregiver may also look for signs of metabolic syndrome. This is also called Syndrome X or Insulin Resistance Syndrome. You may have this syndrome if you have type 2 diabetes, abdominal obesity, and abnormal blood lipids in addition to hypertension. °· Your caregiver will take your medical and family history and perform a physical exam. °· Diagnostic tests may include blood tests (for glucose, cholesterol, potassium, and kidney function), a urinalysis, or an EKG. Other tests may also be necessary depending on your condition. °PREVENTION  °There are important lifestyle issues that you can adopt to reduce your chance of developing hypertension: °· Maintain a normal weight. °· Limit the amount of salt (sodium) in your diet. °· Exercise often. °· Limit alcohol intake. °· Get enough potassium in your diet. Discuss specific advice with your caregiver. °· Follow a DASH diet (dietary approaches to stop hypertension). This diet is rich in fruits, vegetables, and low-fat dairy products, and avoids certain fats. °PROGNOSIS  °Essential hypertension cannot be cured. Lifestyle changes and medical treatment can lower blood pressure and reduce complications. The prognosis of secondary hypertension depends on the underlying cause. Many people whose hypertension is controlled with medicine or lifestyle changes can live a normal, healthy life.  °RISKS AND COMPLICATIONS  °While high blood pressure alone is not an illness, it often requires treatment due to its short- and long-term effects on many organs. Hypertension increases your risk for: °· CVAs or strokes (cerebrovascular accident). °· Heart failure due to chronically high blood pressure (hypertensive cardiomyopathy). °· Heart attack (myocardial infarction). °· Damage to the retina (hypertensive retinopathy). °· Kidney failure (hypertensive nephropathy). °Your caregiver can explain list items above that apply to you. Treatment  of hypertension can significantly reduce the risk of complications. °TREATMENT  °· For overweight patients, weight loss and regular exercise are recommended. Physical fitness lowers blood pressure. °· Mild hypertension is usually treated with diet and exercise. A diet rich in fruits and vegetables, fat-free dairy products, and foods low in fat and salt (sodium) can help lower blood pressure. Decreasing salt intake decreases blood pressure in a 1/3 of people. °· Stop smoking if you are a smoker. °The steps above are highly effective in reducing blood pressure. While these actions are easy to suggest, they are difficult to achieve. Most patients with moderate or severe hypertension end up requiring medications to bring their blood pressure down to a normal level. There are several classes of medications for treatment. Blood pressure pills (antihypertensives) will lower blood pressure by their different actions. Lowering the blood pressure by 10 mmHg may decrease the risk of complications by as much as 25%. °The goal of treatment is effective blood pressure control. This will reduce your risk for complications. Your caregiver will help you determine the best treatment for you according to your lifestyle. What is excellent treatment for one person, may not be for you. °HOME CARE INSTRUCTIONS  °· Do not smoke. °· Follow the lifestyle changes outlined in the "Prevention" section. °· If you are on medications, follow the directions carefully. Blood pressure medications must be taken as prescribed. Skipping doses reduces their benefit. It also puts you at risk for problems. °· Follow up with your caregiver, as directed. °· If you are asked to monitor your blood pressure at home, follow the guidelines in the "Diagnosis" section above. °SEEK MEDICAL CARE   IF:  °· You think you are having medication side effects. °· You have recurrent headaches or lightheadedness. °· You have swelling in your ankles. °· You have trouble with  your vision. °SEEK IMMEDIATE MEDICAL CARE IF:  °· You have sudden onset of chest pain or pressure, difficulty breathing, or other symptoms of a heart attack. °· You have a severe headache. °· You have symptoms of a stroke (such as sudden weakness, difficulty speaking, difficulty walking). °MAKE SURE YOU:  °· Understand these instructions. °· Will watch your condition. °· Will get help right away if you are not doing well or get worse. °Document Released: 03/23/2005 Document Revised: 06/15/2011 Document Reviewed: 10/21/2006 °[employer]® Patient Information ©2014 [employer]. ° °

## 2013-07-11 NOTE — ED Provider Notes (Signed)
CSN: [id number]     Arrival date & time 07/10/13  2250 History   First MD Initiated Contact with Patient 07/11/13 0345     Chief Complaint  Patient presents with  . Hypertension  . Dizziness     (Consider location/radiation/quality/duration/timing/severity/associated sxs/prior Treatment) HPI HX per PT - worried about his BP - went to [facility] and had his BP checked. IT was elevated to 154/99, he became concerned and presents here. He gets occasional HAs and dizziness and felt like his DM medications may be too strong, so he stopped them 2 days ago.  Followed by urgent care on [address].  No F/C. No weakness or numbness. No CP or SOB. He was very anxious but feels better now with BP improved.    Past Medical History  Diagnosis Date  . Allergy   . Hypertension   . Diabetes mellitus without complication    Past Surgical History  Procedure Laterality Date  . Cholecystectomy     Family History  Problem Relation Age of Onset  . Stroke Father    History  Substance Use Topics  . Smoking status: Former Smoker -- 0.25 packs/day  . Smokeless tobacco: Not on file  . Alcohol Use: No    Review of Systems  Constitutional: Negative for fever and chills.  Eyes: Negative for visual disturbance.  Respiratory: Negative for shortness of breath.   Cardiovascular: Negative for chest pain.  Gastrointestinal: Negative for vomiting and abdominal pain.  Genitourinary: Negative for dysuria.  Musculoskeletal: Negative for back pain.  Skin: Negative for rash.  Neurological: Positive for headaches.  All other systems reviewed and are negative.      Allergies  Review of patient's allergies indicates no known allergies.  Home Medications   Current Outpatient Rx  Name  Route  Sig  Dispense  Refill  . acetaminophen (TYLENOL) 500 MG tablet   Oral   Take 1,000 mg by mouth every 6 (six) hours as needed (pain).          . fluticasone (FLONASE) 50 MCG/ACT nasal spray   Each Nare    Place 2 sprays into both nostrils daily.   16 g   12   . ibuprofen (ADVIL,MOTRIN) 600 MG tablet   Oral   Take 1 tablet (600 mg total) by mouth every 8 (eight) hours as needed.   30 tablet   1   . metFORMIN (GLUCOPHAGE) 500 MG tablet   Oral   Take 1 tablet (500 mg total) by mouth daily with breakfast.   30 tablet   1    BP 122/89  Pulse 79  Temp(Src) 97.6 F (36.4 C) (Oral)  Resp 18  Ht 6\' 1"  (1.854 m)  Wt 184 lb (83.462 kg)  BMI 24.28 kg/m2  SpO2 100% Physical Exam  Constitutional: He is oriented to person, place, and time. He appears well-developed and well-nourished.  HENT:  Head: Normocephalic and atraumatic.  Eyes: EOM are normal. Pupils are equal, round, and reactive to light.  Neck: Neck supple.  Cardiovascular: Normal rate, regular rhythm and intact distal pulses.   Pulmonary/Chest: Effort normal and breath sounds normal. No respiratory distress. He exhibits no tenderness.  Abdominal: Soft. He exhibits no distension. There is no tenderness.  Musculoskeletal: Normal range of motion. He exhibits no edema.  Neurological: He is alert and oriented to person, place, and time. He displays normal reflexes. No cranial nerve deficit. Coordination normal.  Skin: Skin is warm and dry.    ED Course  Procedures (including critical care time) Labs Review Labs Reviewed  CBG MONITORING, ED - Abnormal; Notable for the following:    Glucose-Capillary 152 (*)    All other components within normal limits   Imaging Review No results found.   EKG Interpretation None     Filed Vitals:   07/11/13 0153  BP: 122/89  Pulse: 79  Temp:   Resp:    His blood pressure has normalized in the emergency department as above. Has scheduled f/u PCP in am - will keep that appt.  I spent a good deal of time talking to patient about diabetes and meal planning. Written instructions also provided. Return precautions verbalizes understood.  MDM   Diagnoses: Hypertension,  diabetes  Blood sugar is above. Blood pressure normalized No indication for further workup at this time Vital signs and nursing notes reviewed and considered    Sunnie NielsenBrian Thao Vanover, MD 07/11/13 0401

## 2013-07-11 NOTE — ED Notes (Signed)
Pt states that he started taking metformin and another DM II medication and was feeling dizzy. States he stopped taking that 2 days ago but [facility] and it was high. Normotensive at this time. Alert and oriented.

## 2013-08-29 ENCOUNTER — Encounter (HOSPITAL_COMMUNITY): Payer: Self-pay | Admitting: Emergency Medicine

## 2013-08-29 ENCOUNTER — Emergency Department (HOSPITAL_COMMUNITY)
Admission: EM | Admit: 2013-08-29 | Discharge: 2013-08-29 | Disposition: A | Discharge status: Home / Self Care | Payer: Self-pay | Attending: Emergency Medicine | Admitting: Emergency Medicine

## 2013-08-29 DIAGNOSIS — R7402 Elevation of levels of lactic acid dehydrogenase (LDH): Secondary | ICD-10-CM | POA: Insufficient documentation

## 2013-08-29 DIAGNOSIS — R202 Paresthesia of skin: Secondary | ICD-10-CM

## 2013-08-29 DIAGNOSIS — R739 Hyperglycemia, unspecified: Secondary | ICD-10-CM

## 2013-08-29 DIAGNOSIS — IMO0002 Reserved for concepts with insufficient information to code with codable children: Secondary | ICD-10-CM | POA: Insufficient documentation

## 2013-08-29 DIAGNOSIS — I10 Essential (primary) hypertension: Secondary | ICD-10-CM | POA: Insufficient documentation

## 2013-08-29 DIAGNOSIS — R7401 Elevation of levels of liver transaminase levels: Secondary | ICD-10-CM | POA: Insufficient documentation

## 2013-08-29 DIAGNOSIS — E119 Type 2 diabetes mellitus without complications: Secondary | ICD-10-CM | POA: Insufficient documentation

## 2013-08-29 DIAGNOSIS — Z87891 Personal history of nicotine dependence: Secondary | ICD-10-CM | POA: Insufficient documentation

## 2013-08-29 DIAGNOSIS — R209 Unspecified disturbances of skin sensation: Secondary | ICD-10-CM | POA: Insufficient documentation

## 2013-08-29 DIAGNOSIS — R740 Nonspecific elevation of levels of transaminase and lactic acid dehydrogenase [LDH]: Secondary | ICD-10-CM

## 2013-08-29 DIAGNOSIS — Z79899 Other long term (current) drug therapy: Secondary | ICD-10-CM | POA: Insufficient documentation

## 2013-08-29 LAB — COMPREHENSIVE METABOLIC PANEL
ALT: 83 U/L — ABNORMAL HIGH (ref 0–53)
AST: 55 U/L — ABNORMAL HIGH (ref 0–37)
Albumin: 3.7 g/dL (ref 3.5–5.2)
Alkaline Phosphatase: 85 U/L (ref 39–117)
BUN: 13 mg/dL (ref 6–23)
CHLORIDE: 98 meq/L (ref 96–112)
CO2: 25 meq/L (ref 19–32)
CREATININE: 0.69 mg/dL (ref 0.50–1.35)
Calcium: 9.4 mg/dL (ref 8.4–10.5)
GFR calc non Af Amer: >90 mL/min (ref >90)
GLUCOSE: 255 mg/dL — AB (ref 70–99)
Potassium: 4 mEq/L (ref 3.7–5.3)
Sodium: 135 mEq/L — ABNORMAL LOW (ref 137–147)
Total Protein: 7.3 g/dL (ref 6.0–8.3)

## 2013-08-29 LAB — CBC WITH DIFFERENTIAL/PLATELET
BASOS ABS: 0 10*3/uL (ref 0.0–0.1)
Basophils Relative: 0 % (ref 0–1)
Eosinophils Absolute: 1.2 10*3/uL — ABNORMAL HIGH (ref 0.0–0.7)
Eosinophils Relative: 13 % — ABNORMAL HIGH (ref 0–5)
HCT: 39.6 % (ref 39.0–52.0)
Hemoglobin: 13.3 g/dL (ref 13.0–17.0)
LYMPHS ABS: 4.3 10*3/uL — AB (ref 0.7–4.0)
Lymphocytes Relative: 47 % — ABNORMAL HIGH (ref 12–46)
MCH: 26.3 pg (ref 26.0–34.0)
MCHC: 33.6 g/dL (ref 30.0–36.0)
MCV: 78.3 fL (ref 78.0–100.0)
MONOS PCT: 8 % (ref 3–12)
Monocytes Absolute: 0.7 10*3/uL (ref 0.1–1.0)
NEUTROS PCT: 32 % — AB (ref 43–77)
Neutro Abs: 2.9 10*3/uL (ref 1.7–7.7)
PLATELETS: 313 10*3/uL (ref 150–400)
RBC: 5.06 MIL/uL (ref 4.22–5.81)
RDW: 13.9 % (ref 11.5–15.5)
WBC: 9.1 10*3/uL (ref 4.0–10.5)

## 2013-08-29 MED ORDER — METFORMIN HCL 500 MG PO TABS: 500.0000 mg | ORAL_TABLET | Freq: Two times a day (BID) | ORAL | Status: DC

## 2013-08-29 NOTE — ED Notes (Signed)
Pt c/o tingling and dizziness at midnight; pt states he feel like he might "pass out"; BP WNL; Pt state he has overall weakness when he stands. Stroke screen was with EMS was negative; Ambulatory on arrival; Pt believes abdomen feels "more ridged" than normal.

## 2013-08-29 NOTE — ED Notes (Signed)
Phlebotomy at bedside.

## 2013-08-29 NOTE — ED Notes (Signed)
MD at bedside. 

## 2013-08-29 NOTE — ED Provider Notes (Signed)
CSN: [id number]     Arrival date & time 08/29/13  0451 History   First MD Initiated Contact with Patient 08/29/13 [phone]     Chief Complaint  Patient presents with  . Numbness     (Consider location/radiation/quality/duration/timing/severity/associated sxs/prior Treatment) HPI Comments: The patient is a 38 year old male from the country of the [place], he presents with a complaint of numbness and tingling to his entire body. The patient states that he awoke from sleep this morning, had a feeling of dizziness which he states felt like he was going to pass out but no vertigo. He did not have any loss of consciousness but does state that he had associated numbness and weakness and tingling to all 4 extremities including bilateral arms, bilateral legs. He denies any tingling or numbness of his face and has had no difficulty with speech or vision. He does report having a history of having this occur one time in the past and notes that he does have sometimes anxiety which gives him similar symptoms. At this time the patient denies any chest pain, shortness of breath, palpitations, nausea, vomiting, dysuria or diarrhea and has had no rectal bleeding or change in the color of his stools. He has been on medications for blood sugar in the past but states that he has not had them in over one month. He does not give a reason for stopping this medication. Paramedics found the patient to be in good health with a blood sugar of approximately 200, blood pressure was slightly elevated, no medications were given prehospital.  The history is provided by the patient.    Past Medical History  Diagnosis Date  . Allergy   . Hypertension   . Diabetes mellitus without complication    Past Surgical History  Procedure Laterality Date  . Cholecystectomy     Family History  Problem Relation Age of Onset  . Stroke Father    History  Substance Use Topics  . Smoking status: Former Smoker -- 0.25 packs/day  . Smokeless  tobacco: Not on file  . Alcohol Use: No    Review of Systems  All other systems reviewed and are negative.     Allergies  Review of patient's allergies indicates no known allergies.  Home Medications   Prior to Admission medications   Medication Sig Start Date End Date Taking? Authorizing Provider  acetaminophen (TYLENOL) 500 MG tablet Take 1,000 mg by mouth every 6 (six) hours as needed (pain).     Historical Provider, MD  fluticasone (FLONASE) 50 MCG/ACT nasal spray Place 2 sprays into both nostrils daily. 06/28/13   Eleanore Delia Chimes, PA-C  ibuprofen (ADVIL,MOTRIN) 600 MG tablet Take 1 tablet (600 mg total) by mouth every 8 (eight) hours as needed. 06/28/13   Eleanore Delia Chimes, PA-C  metFORMIN (GLUCOPHAGE) 500 MG tablet Take 1 tablet (500 mg total) by mouth daily with breakfast. 06/28/13   Eleanore E Egan, PA-C   BP 131/85  Pulse 58  Temp(Src) 98 F (36.7 C) (Oral)  Resp 20  SpO2 98% Physical Exam  Nursing note and vitals reviewed. Constitutional: He appears well-developed and well-nourished. No distress.  HENT:  Head: Normocephalic and atraumatic.  Mouth/Throat: Oropharynx is clear and moist. No oropharyngeal exudate.  Eyes: Conjunctivae and EOM are normal. Pupils are equal, round, and reactive to light. Right eye exhibits no discharge. Left eye exhibits no discharge. No scleral icterus.  Neck: Normal range of motion. Neck supple. No JVD present. No thyromegaly present.  Cardiovascular: Normal  rate, regular rhythm, normal heart sounds and intact distal pulses.  Exam reveals no gallop and no friction rub.   No murmur heard. Pulmonary/Chest: Effort normal and breath sounds normal. No respiratory distress. He has no wheezes. He has no rales.  Abdominal: Soft. Bowel sounds are normal. He exhibits no distension and no mass. There is no tenderness.  Musculoskeletal: Normal range of motion. He exhibits no edema and no tenderness.  Lymphadenopathy:    He has no cervical adenopathy.   Neurological: He is alert. Coordination normal.  Speech is clear, cranial nerves III through XII are intact, memory is intact, strength is normal in all 4 extremities including grips, sensation is intact to light touch and pinprick in all 4 extremities. Coordination as tested by finger-nose-finger is normal, no limb ataxia. Normal gait, normal reflexes at the patellar tendons bilaterally  Skin: Skin is warm and dry. No rash noted. He is not diaphoretic. No erythema.  Psychiatric: He has a normal mood and affect. His behavior is normal.    ED Course  Procedures (including critical care time) Labs Review Labs Reviewed  COMPREHENSIVE METABOLIC PANEL - Abnormal; Notable for the following:    Sodium 135 (*)    Glucose, Bld 255 (*)    AST 55 (*)    ALT 83 (*)    Total Bilirubin <0.2 (*)    All other components within normal limits  CBC WITH DIFFERENTIAL - Abnormal; Notable for the following:    Neutrophils Relative % 32 (*)    Lymphocytes Relative 47 (*)    Eosinophils Relative 13 (*)    Lymphs Abs 4.3 (*)    Eosinophils Absolute 1.2 (*)    All other components within normal limits    Imaging Review No results found.   EKG Interpretation None      MDM   Final diagnoses:  Paresthesias  Transaminitis  Hyperglycemia    At this time the patient has no focal neurologic deficits. There is no lateralization of the patient's symptoms to suggest a neurologic source and in addition the patient claims he has had similar symptoms with anxiety attacks in the past. Will check for anemia or electrolyte abnormalities, otherwise patient appears stable.   Pt has no sx at d/c - unsure of exact cause - he endorses not taking DM meds in > 1 month.  Given Rx for metformin.  Vida RollerBrian D Audi Wettstein, MD 08/29/13 [phone]

## 2013-08-29 NOTE — ED Notes (Signed)
Called Lab to check status of Lab results

## 2013-08-29 NOTE — Discharge Instructions (Signed)
Your liver tests were slightly elevated - you need to have this rechecked in the next 2 weeks.  Do not use alcohol or use tylenol until after you see your doctor in follow up.  Your blood sugar was also elevated - you MUST start taking the medicine for this as this may be contributing to your symtpoms.  Please call your doctor for a followup appointment within 24-48 hours. When you talk to your doctor please let them know that you were seen in the emergency department and have them acquire all of your records so that they can discuss the findings with you and formulate a treatment plan to fully care for your new and ongoing problems.

## 2013-09-14 ENCOUNTER — Emergency Department (HOSPITAL_COMMUNITY)
Admission: EM | Admit: 2013-09-14 | Discharge: 2013-09-14 | Disposition: A | Discharge status: Home / Self Care | Payer: Self-pay | Attending: Emergency Medicine | Admitting: Emergency Medicine

## 2013-09-14 ENCOUNTER — Encounter (HOSPITAL_COMMUNITY): Payer: Self-pay | Admitting: Emergency Medicine

## 2013-09-14 DIAGNOSIS — Z79899 Other long term (current) drug therapy: Secondary | ICD-10-CM | POA: Insufficient documentation

## 2013-09-14 DIAGNOSIS — R209 Unspecified disturbances of skin sensation: Secondary | ICD-10-CM | POA: Insufficient documentation

## 2013-09-14 DIAGNOSIS — E119 Type 2 diabetes mellitus without complications: Secondary | ICD-10-CM | POA: Insufficient documentation

## 2013-09-14 DIAGNOSIS — R5381 Other malaise: Secondary | ICD-10-CM | POA: Insufficient documentation

## 2013-09-14 DIAGNOSIS — R5383 Other fatigue: Secondary | ICD-10-CM | POA: Insufficient documentation

## 2013-09-14 DIAGNOSIS — R202 Paresthesia of skin: Secondary | ICD-10-CM

## 2013-09-14 DIAGNOSIS — Z87891 Personal history of nicotine dependence: Secondary | ICD-10-CM | POA: Insufficient documentation

## 2013-09-14 DIAGNOSIS — I10 Essential (primary) hypertension: Secondary | ICD-10-CM | POA: Insufficient documentation

## 2013-09-14 LAB — URINALYSIS, ROUTINE W REFLEX MICROSCOPIC
Bilirubin Urine: NEGATIVE
Glucose, UA: NEGATIVE mg/dL
Hgb urine dipstick: NEGATIVE
Ketones, ur: NEGATIVE mg/dL
LEUKOCYTES UA: NEGATIVE
NITRITE: NEGATIVE
PROTEIN: NEGATIVE mg/dL
Specific Gravity, Urine: 1.025 (ref 1.005–1.030)
UROBILINOGEN UA: 1 mg/dL (ref 0.0–1.0)
pH: 5.5 (ref 5.0–8.0)

## 2013-09-14 LAB — BASIC METABOLIC PANEL
BUN: 14 mg/dL (ref 6–23)
CALCIUM: 9.8 mg/dL (ref 8.4–10.5)
CHLORIDE: 99 meq/L (ref 96–112)
CO2: 23 mEq/L (ref 19–32)
CREATININE: 0.64 mg/dL (ref 0.50–1.35)
Glucose, Bld: 135 mg/dL — ABNORMAL HIGH (ref 70–99)
Potassium: 3.9 mEq/L (ref 3.7–5.3)
Sodium: 137 mEq/L (ref 137–147)

## 2013-09-14 LAB — CBG MONITORING, ED: GLUCOSE-CAPILLARY: 124 mg/dL — AB (ref 70–99)

## 2013-09-14 LAB — CBC
HCT: 43.5 % (ref 39.0–52.0)
Hemoglobin: 14.6 g/dL (ref 13.0–17.0)
MCH: 25.9 pg — ABNORMAL LOW (ref 26.0–34.0)
MCHC: 33.6 g/dL (ref 30.0–36.0)
MCV: 77.3 fL — AB (ref 78.0–100.0)
PLATELETS: 356 10*3/uL (ref 150–400)
RBC: 5.63 MIL/uL (ref 4.22–5.81)
RDW: 13.6 % (ref 11.5–15.5)
WBC: 10.5 10*3/uL (ref 4.0–10.5)

## 2013-09-14 MED ORDER — ACETAMINOPHEN 325 MG PO TABS
650.0000 mg | ORAL_TABLET | Freq: Once | ORAL | Status: AC
  Administered 2013-09-14: 650 mg via ORAL
  Filled 2013-09-14: qty 2

## 2013-09-14 NOTE — Discharge Instructions (Signed)
Return to the ED with any concerns including fever/chills, difficulty breathing, chest or abdominal pain, vomiting and not able to keep down liquids, decreased level of alertness/lethargy, or any other alarming symptoms  You should be sure to take your metformin twice daily as prescribed.  Please call the number provided to arrange for a followup appointment

## 2013-09-14 NOTE — ED Notes (Signed)
Pt states that he is diabetic but stopped taking his metformin a month ago because he thought it was "elevating his liver".  Pt states that he has been feeling gen weakness x 2 days with tingling in lower extremities.

## 2013-09-14 NOTE — ED Provider Notes (Signed)
CSN: [id number]     Arrival date & time 09/14/13  1744 History   First MD Initiated Contact with Patient 09/14/13 2007     Chief Complaint  Patient presents with  . Weakness  . Numbness     (Consider location/radiation/quality/duration/timing/severity/associated sxs/prior Treatment) HPI Pt presenting with c/o tingling and numbness intermittently in his 4 extremities.  He states he also has some generalized weakness over the past 2 days.  Per chart review he was seen for similar symptoms several weeks ago in the ED.  He states he has not been taking his metformin due to concerns that he has about side effects.  He also seems to have limited understanding about the concerns associated with high blood sugar.  No shortness of breath, no chest pain, no abdominal pain.  No vomiting or diarrhea.  He has been drinking liquids normally.  There are no other associated systemic symptoms, there are no other alleviating or modifying factors.   Past Medical History  Diagnosis Date  . Allergy   . Hypertension   . Diabetes mellitus without complication    Past Surgical History  Procedure Laterality Date  . Cholecystectomy     Family History  Problem Relation Age of Onset  . Stroke Father    History  Substance Use Topics  . Smoking status: Former Smoker -- 0.25 packs/day  . Smokeless tobacco: Not on file  . Alcohol Use: No    Review of Systems ROS reviewed and all otherwise negative except for mentioned in HPI    Allergies  Review of patient's allergies indicates no known allergies.  Home Medications   Prior to Admission medications   Medication Sig Start Date End Date Taking? Authorizing Provider  acetaminophen (TYLENOL) 500 MG tablet Take 1,000 mg by mouth every 6 (six) hours as needed (pain).    Yes Historical Provider, MD  fluticasone (FLONASE) 50 MCG/ACT nasal spray Place 2 sprays into both nostrils daily as needed for allergies or rhinitis.   Yes Historical Provider, MD   metFORMIN (GLUCOPHAGE) 500 MG tablet Take 1 tablet (500 mg total) by mouth 2 (two) times daily with a meal. 08/29/13  Yes Vida RollerBrian D Miller, MD   BP 138/94  Pulse 71  Temp(Src) 98.7 F (37.1 C) (Oral)  Resp 16  SpO2 99% Vitals reviewed Physical Exam Physical Examination: General appearance - alert, well appearing, and in no distress Mental status - alert, oriented to person, place, and time Eyes - no conjunctival injection, no scleral icterus Mouth - mucous membranes moist, pharynx normal without lesions Chest - clear to auscultation, no wheezes, rales or rhonchi, symmetric air entry Heart - normal rate, regular rhythm, normal S1, S2, no murmurs, rubs, clicks or gallops Abdomen - soft, nontender, nondistended, no masses or organomegaly Neurological - alert, oriented x 4, cranial nerves grossly intact, strength 5/5 in extremities, sensation intact Extremities - peripheral pulses normal, no pedal edema, no clubbing or cyanosis Skin - normal coloration and turgor, no rashes  ED Course  Procedures (including critical care time) Labs Review Labs Reviewed  CBC - Abnormal; Notable for the following:    MCV 77.3 (*)    MCH 25.9 (*)    All other components within normal limits  BASIC METABOLIC PANEL - Abnormal; Notable for the following:    Glucose, Bld 135 (*)    All other components within normal limits  CBG MONITORING, ED - Abnormal; Notable for the following:    Glucose-Capillary 124 (*)    All other  components within normal limits  URINALYSIS, ROUTINE W REFLEX MICROSCOPIC    Imaging Review No results found.   EKG Interpretation None      MDM   Final diagnoses:  Paresthesia  Fatigue    Pt with reassuring exam.  Blood glucose reassuring.  Pt is drinking liquids.  Pt has low grade temperature, may have a viral illness.  Long discussion with patient about importance of taking his metformin and following up with primary care doctor.  Discharged with strict return  precautions.  Pt agreeable with plan.    Ethelda Chick, MD 09/14/13 [phone]

## 2014-01-21 ENCOUNTER — Emergency Department (HOSPITAL_COMMUNITY): Payer: No Typology Code available for payment source

## 2014-01-21 ENCOUNTER — Emergency Department (HOSPITAL_COMMUNITY)
Admission: EM | Admit: 2014-01-21 | Discharge: 2014-01-21 | Disposition: A | Discharge status: Home / Self Care | Payer: No Typology Code available for payment source | Attending: Emergency Medicine | Admitting: Emergency Medicine

## 2014-01-21 ENCOUNTER — Encounter (HOSPITAL_COMMUNITY): Payer: Self-pay | Admitting: Emergency Medicine

## 2014-01-21 DIAGNOSIS — Z7951 Long term (current) use of inhaled steroids: Secondary | ICD-10-CM | POA: Diagnosis not present

## 2014-01-21 DIAGNOSIS — S42022A Displaced fracture of shaft of left clavicle, initial encounter for closed fracture: Secondary | ICD-10-CM | POA: Insufficient documentation

## 2014-01-21 DIAGNOSIS — Y9389 Activity, other specified: Secondary | ICD-10-CM | POA: Insufficient documentation

## 2014-01-21 DIAGNOSIS — Z79899 Other long term (current) drug therapy: Secondary | ICD-10-CM | POA: Diagnosis not present

## 2014-01-21 DIAGNOSIS — E119 Type 2 diabetes mellitus without complications: Secondary | ICD-10-CM | POA: Diagnosis not present

## 2014-01-21 DIAGNOSIS — S40912A Unspecified superficial injury of left shoulder, initial encounter: Secondary | ICD-10-CM | POA: Diagnosis present

## 2014-01-21 DIAGNOSIS — Z87891 Personal history of nicotine dependence: Secondary | ICD-10-CM | POA: Diagnosis not present

## 2014-01-21 DIAGNOSIS — I10 Essential (primary) hypertension: Secondary | ICD-10-CM | POA: Diagnosis not present

## 2014-01-21 DIAGNOSIS — Y92410 Unspecified street and highway as the place of occurrence of the external cause: Secondary | ICD-10-CM | POA: Diagnosis not present

## 2014-01-21 DIAGNOSIS — S42002A Fracture of unspecified part of left clavicle, initial encounter for closed fracture: Secondary | ICD-10-CM

## 2014-01-21 MED ORDER — IBUPROFEN 800 MG PO TABS: 800.0000 mg | ORAL_TABLET | Freq: Three times a day (TID) | ORAL | Status: DC

## 2014-01-21 MED ORDER — HYDROCODONE-ACETAMINOPHEN 5-325 MG PO TABS: 1.0000 | ORAL_TABLET | ORAL | Status: DC | PRN

## 2014-01-21 MED ORDER — HYDROCODONE-ACETAMINOPHEN 5-325 MG PO TABS
1.0000 | ORAL_TABLET | Freq: Once | ORAL | Status: AC
  Administered 2014-01-21: 1 via ORAL
  Filled 2014-01-21: qty 1

## 2014-01-21 MED ORDER — IBUPROFEN 800 MG PO TABS
800.0000 mg | ORAL_TABLET | Freq: Once | ORAL | Status: AC
  Administered 2014-01-21: 800 mg via ORAL
  Filled 2014-01-21: qty 4
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
## 2014-01-21 NOTE — ED Provider Notes (Signed)
Medical screening examination/treatment/procedure(s) were performed by non-physician practitioner and as supervising physician I was immediately available for consultation/collaboration.   EKG Interpretation None        Ian SeamenJohn L Cecillia Menees, MD 01/21/14 [phone]
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
## 2014-01-21 NOTE — ED Notes (Signed)
Patient transported to X-ray 
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
## 2014-01-27 ENCOUNTER — Encounter (HOSPITAL_COMMUNITY): Payer: Self-pay | Admitting: Emergency Medicine

## 2014-01-27 ENCOUNTER — Emergency Department (HOSPITAL_COMMUNITY)
Admission: EM | Admit: 2014-01-27 | Discharge: 2014-01-27 | Disposition: A | Discharge status: Home / Self Care | Payer: No Typology Code available for payment source | Attending: Emergency Medicine | Admitting: Emergency Medicine

## 2014-01-27 DIAGNOSIS — R519 Headache, unspecified: Secondary | ICD-10-CM

## 2014-01-27 DIAGNOSIS — S42002D Fracture of unspecified part of left clavicle, subsequent encounter for fracture with routine healing: Secondary | ICD-10-CM | POA: Insufficient documentation

## 2014-01-27 DIAGNOSIS — I10 Essential (primary) hypertension: Secondary | ICD-10-CM | POA: Insufficient documentation

## 2014-01-27 DIAGNOSIS — Z791 Long term (current) use of non-steroidal anti-inflammatories (NSAID): Secondary | ICD-10-CM | POA: Diagnosis not present

## 2014-01-27 DIAGNOSIS — Z79899 Other long term (current) drug therapy: Secondary | ICD-10-CM | POA: Insufficient documentation

## 2014-01-27 DIAGNOSIS — E119 Type 2 diabetes mellitus without complications: Secondary | ICD-10-CM | POA: Diagnosis not present

## 2014-01-27 DIAGNOSIS — Z87891 Personal history of nicotine dependence: Secondary | ICD-10-CM | POA: Diagnosis not present

## 2014-01-27 DIAGNOSIS — Y9389 Activity, other specified: Secondary | ICD-10-CM | POA: Insufficient documentation

## 2014-01-27 DIAGNOSIS — R51 Headache: Secondary | ICD-10-CM | POA: Diagnosis present

## 2014-01-27 MED ORDER — KETOROLAC TROMETHAMINE 30 MG/ML IJ SOLN
30.0000 mg | Freq: Once | INTRAMUSCULAR | Status: AC
  Administered 2014-01-27: 30 mg via INTRAMUSCULAR
  Filled 2014-01-27: qty 1

## 2014-01-27 MED ORDER — DIPHENHYDRAMINE HCL 25 MG PO CAPS
25.0000 mg | ORAL_CAPSULE | Freq: Once | ORAL | Status: AC
  Administered 2014-01-27: 25 mg via ORAL
  Filled 2014-01-27: qty 1

## 2014-01-27 MED ORDER — METOCLOPRAMIDE HCL 10 MG PO TABS
10.0000 mg | ORAL_TABLET | Freq: Once | ORAL | Status: AC
  Administered 2014-01-27: 10 mg via ORAL
  Filled 2014-01-27: qty 1

## 2014-01-27 MED ORDER — DIAZEPAM 5 MG PO TABS: 5.0000 mg | ORAL_TABLET | Freq: Two times a day (BID) | ORAL | Status: DC

## 2014-01-27 MED ORDER — METOCLOPRAMIDE HCL 10 MG PO TABS: 10.0000 mg | ORAL_TABLET | Freq: Four times a day (QID) | ORAL | Status: DC

## 2014-01-27 NOTE — ED Notes (Signed)
Pt reports headache from bridge of nose to top of head.
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
## 2014-01-27 NOTE — Discharge Instructions (Signed)

## 2014-01-27 NOTE — ED Notes (Addendum)
Pt ambulatory upon dc. He is alert and oriented. He reports he is leaving with ALL belongings he arrived with.

## 2014-01-27 NOTE — ED Notes (Signed)
Pt ambulated to restroom with steady gait.
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
## 2014-01-28 NOTE — ED Provider Notes (Signed)
Medical screening examination/treatment/procedure(s) were performed by non-physician practitioner and as supervising physician I was immediately available for consultation/collaboration.   EKG Interpretation None       Maxamillian Tienda, MD 01/28/14 0125 

## 2014-01-29 LAB — CBG MONITORING, ED: Glucose-Capillary: 175 mg/dL — ABNORMAL HIGH (ref 70–99)

## 2014-03-15 ENCOUNTER — Encounter (HOSPITAL_COMMUNITY): Payer: Self-pay | Admitting: Emergency Medicine

## 2014-03-15 ENCOUNTER — Emergency Department (HOSPITAL_COMMUNITY)
Admission: EM | Admit: 2014-03-15 | Discharge: 2014-03-15 | Disposition: A | Discharge status: Home / Self Care | Payer: No Typology Code available for payment source | Attending: Emergency Medicine | Admitting: Emergency Medicine

## 2014-03-15 DIAGNOSIS — Z79899 Other long term (current) drug therapy: Secondary | ICD-10-CM | POA: Insufficient documentation

## 2014-03-15 DIAGNOSIS — Z87891 Personal history of nicotine dependence: Secondary | ICD-10-CM | POA: Insufficient documentation

## 2014-03-15 DIAGNOSIS — M25551 Pain in right hip: Secondary | ICD-10-CM

## 2014-03-15 DIAGNOSIS — E119 Type 2 diabetes mellitus without complications: Secondary | ICD-10-CM | POA: Insufficient documentation

## 2014-03-15 DIAGNOSIS — I10 Essential (primary) hypertension: Secondary | ICD-10-CM | POA: Insufficient documentation

## 2014-03-15 MED ORDER — IBUPROFEN 800 MG PO TABS: 800.0000 mg | ORAL_TABLET | Freq: Three times a day (TID) | ORAL | Status: DC

## 2014-03-15 NOTE — ED Provider Notes (Signed)
CSN: [id number]     Arrival date & time 03/15/14  0021 History   First MD Initiated Contact with Patient 03/15/14 0155     Chief Complaint  Patient presents with  . Rt hip pain      (Consider location/radiation/quality/duration/timing/severity/associated sxs/prior Treatment) Patient is a 38 y.o. male presenting with hip pain. The history is provided by the patient. No language interpreter was used.  Hip Pain This is a new problem. The current episode started today. Pertinent negatives include no chills or fever. Associated symptoms comments: He reports he woke up with pain in the right hip area without injury. It is worse with movement and better with rest. He denies abdominal pain, N, V, groin pain or dysuria. .    Past Medical History  Diagnosis Date  . Allergy   . Hypertension   . Diabetes mellitus without complication    Past Surgical History  Procedure Laterality Date  . Cholecystectomy     Family History  Problem Relation Age of Onset  . Stroke Father    History  Substance Use Topics  . Smoking status: Former Smoker -- 0.25 packs/day  . Smokeless tobacco: Not on file  . Alcohol Use: No    Review of Systems  Constitutional: Negative for fever and chills.  Gastrointestinal: Negative.   Genitourinary: Negative.   Musculoskeletal: Negative.        See HPI  Skin: Negative.   Neurological: Negative.       Allergies  Review of patient's allergies indicates no known allergies.  Home Medications   Prior to Admission medications   Medication Sig Start Date End Date Taking? Authorizing Provider  acetaminophen (TYLENOL) 500 MG tablet Take 1,000 mg by mouth every 6 (six) hours as needed (pain).     Historical Provider, MD  diazepam (VALIUM) 5 MG tablet Take 1 tablet (5 mg total) by mouth 2 (two) times daily. 01/27/14   Courtney A Forcucci, PA-C  HYDROcodone-acetaminophen (NORCO/VICODIN) 5-325 MG per tablet Take 1-2 tablets by mouth every 4 (four) hours as needed.  01/21/14   Shatasia Cutshaw A Rondel Episcopo, PA-C  ibuprofen (ADVIL,MOTRIN) 800 MG tablet Take 1 tablet (800 mg total) by mouth 3 (three) times daily. 01/21/14   Zamaria Brazzle A Khiya Friese, PA-C  metFORMIN (GLUCOPHAGE) 500 MG tablet Take 1 tablet (500 mg total) by mouth 2 (two) times daily with a meal. 08/29/13   Vida RollerBrian D Miller, MD  metoCLOPramide (REGLAN) 10 MG tablet Take 1 tablet (10 mg total) by mouth every 6 (six) hours. 01/27/14   Courtney A Forcucci, PA-C   BP 137/94 mmHg  Pulse 83  Temp(Src) 98.7 F (37.1 C) (Oral)  Resp 18  Ht 6\' 1"  (1.854 m)  Wt 170 lb (77.111 kg)  BMI 22.43 kg/m2  SpO2 100% Physical Exam  Constitutional: He is oriented to person, place, and time. He appears well-developed and well-nourished.  Neck: Normal range of motion.  Cardiovascular: Intact distal pulses.   Pulmonary/Chest: Effort normal.  Abdominal: There is no tenderness.  Musculoskeletal: Normal range of motion. He exhibits no edema.  Tender to right anterior and lateral hip. FROM, full strength. He is ambulatory without difficulty.   Neurological: He is alert and oriented to person, place, and time.  Skin: Skin is warm and dry.  Psychiatric: He has a normal mood and affect.    ED Course  Procedures (including critical care time) Labs Review Labs Reviewed - No data to display  Imaging Review No results found.   EKG Interpretation None  MDM   Final diagnoses:  None    1. Right hip pain  Recommend ibuprofen and PCP follow up for atraumatic hip pain of 2 hours duration.    Arnoldo HookerShari A Montasia Chisenhall, PA-C 03/15/14 0205  Hanley SeamenJohn L Molpus, MD 03/15/14 [phone]

## 2014-03-15 NOTE — ED Notes (Addendum)
Pt from home c/o right hip pain that started two days ago when he woke up.Denies recent injury.

## 2014-03-15 NOTE — ED Notes (Signed)
Pt ambulatory with steady gait upon DC. He was advised to follow up with PCP if not better.

## 2014-03-15 NOTE — Discharge Instructions (Signed)
Hip Pain Your hip is the joint between your upper legs and your lower pelvis. The bones, cartilage, tendons, and muscles of your hip joint perform a lot of work each day supporting your body weight and allowing you to move around. Hip pain can range from a minor ache to severe pain in one or both of your hips. Pain may be felt on the inside of the hip joint near the groin, or the outside near the buttocks and upper thigh. You may have swelling or stiffness as well.  HOME CARE INSTRUCTIONS   Take medicines only as directed by your health care provider.  Apply ice to the injured area:  Put ice in a plastic bag.  Place a towel between your skin and the bag.  Leave the ice on for 15-20 minutes at a time, 3-4 times a day.  Keep your leg raised (elevated) when possible to lessen swelling.  Avoid activities that cause pain.  Follow specific exercises as directed by your health care provider.  Sleep with a pillow between your legs on your most comfortable side.  Record how often you have hip pain, the location of the pain, and what it feels like. SEEK MEDICAL CARE IF:   You are unable to put weight on your leg.  Your hip is red or swollen or very tender to touch.  Your pain or swelling continues or worsens after 1 week.  You have increasing difficulty walking.  You have a fever. SEEK IMMEDIATE MEDICAL CARE IF:   You have fallen.  You have a sudden increase in pain and swelling in your hip. MAKE SURE YOU:   Understand these instructions.  Will watch your condition.  Will get help right away if you are not doing well or get worse. Document Released: 09/10/2009 Document Revised: 08/07/2013 Document Reviewed: 11/17/2012 [employer] Patient Information 2015 [employer]. This information is not intended to replace advice given to you by your health care provider. Make sure you discuss any questions you have with your health care provider.  

## 2015-03-31 ENCOUNTER — Encounter (HOSPITAL_COMMUNITY): Payer: Self-pay | Admitting: Emergency Medicine

## 2015-03-31 ENCOUNTER — Emergency Department (HOSPITAL_COMMUNITY)
Admission: EM | Admit: 2015-03-31 | Discharge: 2015-03-31 | Disposition: A | Discharge status: Home / Self Care | Payer: Self-pay | Attending: Emergency Medicine | Admitting: Emergency Medicine

## 2015-03-31 DIAGNOSIS — E1165 Type 2 diabetes mellitus with hyperglycemia: Secondary | ICD-10-CM | POA: Insufficient documentation

## 2015-03-31 DIAGNOSIS — I10 Essential (primary) hypertension: Secondary | ICD-10-CM | POA: Insufficient documentation

## 2015-03-31 DIAGNOSIS — R739 Hyperglycemia, unspecified: Secondary | ICD-10-CM

## 2015-03-31 DIAGNOSIS — Z79899 Other long term (current) drug therapy: Secondary | ICD-10-CM | POA: Insufficient documentation

## 2015-03-31 DIAGNOSIS — Z791 Long term (current) use of non-steroidal anti-inflammatories (NSAID): Secondary | ICD-10-CM | POA: Insufficient documentation

## 2015-03-31 DIAGNOSIS — Z7984 Long term (current) use of oral hypoglycemic drugs: Secondary | ICD-10-CM | POA: Insufficient documentation

## 2015-03-31 DIAGNOSIS — Z87891 Personal history of nicotine dependence: Secondary | ICD-10-CM | POA: Insufficient documentation

## 2015-03-31 DIAGNOSIS — R5383 Other fatigue: Secondary | ICD-10-CM

## 2015-03-31 LAB — URINALYSIS, ROUTINE W REFLEX MICROSCOPIC
Bilirubin Urine: NEGATIVE
Glucose, UA: NEGATIVE mg/dL
HGB URINE DIPSTICK: NEGATIVE
KETONES UR: NEGATIVE mg/dL
Leukocytes, UA: NEGATIVE
Nitrite: NEGATIVE
Protein, ur: NEGATIVE mg/dL
Specific Gravity, Urine: 1.024 (ref 1.005–1.030)
pH: 7 (ref 5.0–8.0)

## 2015-03-31 LAB — CBC
HCT: 40.4 % (ref 39.0–52.0)
Hemoglobin: 13.4 g/dL (ref 13.0–17.0)
MCH: 26.3 pg (ref 26.0–34.0)
MCHC: 33.2 g/dL (ref 30.0–36.0)
MCV: 79.4 fL (ref 78.0–100.0)
Platelets: 354 10*3/uL (ref 150–400)
RBC: 5.09 MIL/uL (ref 4.22–5.81)
RDW: 13.9 % (ref 11.5–15.5)
WBC: 13.3 10*3/uL — ABNORMAL HIGH (ref 4.0–10.5)

## 2015-03-31 LAB — COMPREHENSIVE METABOLIC PANEL
ALT: 59 U/L (ref 17–63)
AST: 28 U/L (ref 15–41)
Albumin: 4.2 g/dL (ref 3.5–5.0)
Alkaline Phosphatase: 78 U/L (ref 38–126)
Anion gap: 11 (ref 5–15)
BILIRUBIN TOTAL: 0.3 mg/dL (ref 0.3–1.2)
BUN: 17 mg/dL (ref 6–20)
CHLORIDE: 103 mmol/L (ref 101–111)
CO2: 27 mmol/L (ref 22–32)
Calcium: 9.4 mg/dL (ref 8.9–10.3)
Creatinine, Ser: 0.97 mg/dL (ref 0.61–1.24)
Glucose, Bld: 175 mg/dL — ABNORMAL HIGH (ref 65–99)
POTASSIUM: 4 mmol/L (ref 3.5–5.1)
Sodium: 141 mmol/L (ref 135–145)
TOTAL PROTEIN: 7.8 g/dL (ref 6.5–8.1)

## 2015-03-31 LAB — DIFFERENTIAL
BASOS ABS: 0 10*3/uL (ref 0.0–0.1)
Basophils Relative: 0 %
EOS PCT: 7 %
Eosinophils Absolute: 0.9 10*3/uL — ABNORMAL HIGH (ref 0.0–0.7)
LYMPHS ABS: 4.4 10*3/uL — AB (ref 0.7–4.0)
LYMPHS PCT: 33 %
Monocytes Absolute: 1.7 10*3/uL — ABNORMAL HIGH (ref 0.1–1.0)
Monocytes Relative: 13 %
NEUTROS PCT: 47 %
Neutro Abs: 6.1 10*3/uL (ref 1.7–7.7)

## 2015-03-31 LAB — CBG MONITORING, ED: GLUCOSE-CAPILLARY: 163 mg/dL — AB (ref 65–99)

## 2015-03-31 NOTE — Discharge Instructions (Signed)
Your labs today were unremarkable aside from a slightly elevated blood sugar. Your symptoms could be due to a variety of different issues, but no other emergent causes need to be investigated today. Stay well hydrated. Take your usual home medications. Get plenty of rest. Follow up with your regular doctor for ongoing evaluation of your fatigue. Return to the ER for changes or worsening symptoms.   Fatigue Fatigue is feeling tired all of the time, a lack of energy, or a lack of motivation. Occasional or mild fatigue is often a normal response to activity or life in general. However, long-lasting (chronic) or extreme fatigue may indicate an underlying medical condition. HOME CARE INSTRUCTIONS  Watch your fatigue for any changes. The following actions may help to lessen any discomfort you are feeling:  Talk to your health care provider about how much sleep you need each night. Try to get the required amount every night.  Take medicines only as directed by your health care provider.  Eat a healthy and nutritious diet. Ask your health care provider if you need help changing your diet.  Drink enough fluid to keep your urine clear or pale yellow.  Practice ways of relaxing, such as yoga, meditation, massage therapy, or acupuncture.  Exercise regularly.   Change situations that cause you stress. Try to keep your work and personal routine reasonable.  Do not abuse illegal drugs.  Limit alcohol intake to no more than 1 drink per day for nonpregnant women and 2 drinks per day for men. One drink equals 12 ounces of beer, 5 ounces of wine, or 1 ounces of hard liquor.  Take a multivitamin, if directed by your health care provider. SEEK MEDICAL CARE IF:   Your fatigue does not get better.  You have a fever.   You have unintentional weight loss or gain.  You have headaches.   You have difficulty:   Falling asleep.  Sleeping throughout the night.  You feel angry, guilty, anxious, or  sad.   You are unable to have a bowel movement (constipation).   You skin is dry.   Your legs or another part of your body is swollen.  SEEK IMMEDIATE MEDICAL CARE IF:   You feel confused.   Your vision is blurry.  You feel faint or pass out.   You have a severe headache.   You have severe abdominal, pelvic, or back pain.   You have chest pain, shortness of breath, or an irregular or fast heartbeat.   You are unable to urinate or you urinate less than normal.   You develop abnormal bleeding, such as bleeding from the rectum, vagina, nose, lungs, or nipples.  You vomit blood.   You have thoughts about harming yourself or committing suicide.   You are worried that you might harm someone else.    This information is not intended to replace advice given to you by your health care provider. Make sure you discuss any questions you have with your health care provider.   Document Released: 01/18/2007 Document Revised: 04/13/2014 Document Reviewed: 07/25/2013 Elsevier Interactive Patient Education 2016 [employer]  Hyperglycemia High blood sugar (hyperglycemia) means that the level of sugar in your blood is higher than it should be. Signs of high blood sugar include:  Feeling thirsty.  Frequent peeing (urinating).  Feeling tired or sleepy.  Dry mouth.  Vision changes.  Feeling weak.  Feeling hungry but losing weight.  Numbness and tingling in your hands or feet.  Headache. When you  ignore these signs, your blood sugar may keep going up. These problems may get worse, and other problems may begin. HOME CARE  Check your blood sugars as told by your doctor. Write down the numbers with the date and time.  Take the right amount of insulin or diabetes pills at the right time. Write down the dose with date and time.  Refill your insulin or diabetes pills before running out.  Watch what you eat. Follow your meal plan.  Drink liquids without sugar,  such as water. Check with your doctor if you have kidney or heart disease.  Follow your doctor's orders for exercise. Exercise at the same time of day.  Keep your doctor's appointments. GET HELP RIGHT AWAY IF:   You have trouble thinking or are confused.  You have fast breathing with fruity smelling breath.  You pass out (faint).  You have 2 to 3 days of high blood sugars and you do not know why.  You have chest pain.  You are feeling sick to your stomach (nauseous) or throwing up (vomiting).  You have sudden vision changes. MAKE SURE YOU:   Understand these instructions.  Will watch your condition.  Will get help right away if you are not doing well or get worse.   This information is not intended to replace advice given to you by your health care provider. Make sure you discuss any questions you have with your health care provider.   Document Released: 01/18/2009 Document Revised: 04/13/2014 Document Reviewed: 11/27/2014 Elsevier Interactive Patient Education [employer]

## 2015-03-31 NOTE — ED Notes (Signed)
Patient states that he feel tired. He states that he has not taken his metformin. Patient thinks that he is tired due to his blood sugar.

## 2015-03-31 NOTE — ED Provider Notes (Signed)
CSN: [id number]     Arrival date & time 03/31/15  0018 History   First MD Initiated Contact with Patient 03/31/15 0101     Chief Complaint  Patient presents with  . Fatigue     (Consider location/radiation/quality/duration/timing/severity/associated sxs/prior Treatment) HPI Comments: Ian Moreno is a 39 y.o. male with a PMHx of DM2, HTN, and PSHx of cholecystectomy, who presents to the ED with complaints of fatigue 1 week. Patient states that he has had generalized fatigue for the last 1 week which seems to worsen when he has a BM but also is ongoing at other times, and states that he lost 5 pounds in the last 2 weeks. No alleviating factors given that he hasn't tried anything at home. He reports he stopped taking his metformin 2 days ago but his symptoms had began prior to this. He denies any fevers, chills, night sweats, chest pain, shortness breath, cough, URI symptoms, abdominal pain, nausea, vomiting, diarrhea, constipation, melena, hematochezia, dysuria, hematuria, numbness, tingling, weakness, polyuria and polydipsia.  The history is provided by the patient. No language interpreter was used.    Past Medical History  Diagnosis Date  . Allergy   . Hypertension   . Diabetes mellitus without complication [facility])    Past Surgical History  Procedure Laterality Date  . Cholecystectomy     Family History  Problem Relation Age of Onset  . Stroke Father    Social History  Substance Use Topics  . Smoking status: Former Smoker -- 0.25 packs/day  . Smokeless tobacco: None  . Alcohol Use: No    Review of Systems  Constitutional: Positive for fatigue and unexpected weight change (5lbs in 2 wks). Negative for fever and chills.  HENT: Negative for ear discharge, ear pain, rhinorrhea and sore throat.   Respiratory: Negative for cough and shortness of breath.   Cardiovascular: Negative for chest pain.  Gastrointestinal: Negative for nausea, vomiting, abdominal pain, diarrhea,  constipation and blood in stool.  Endocrine: Negative for polydipsia and polyuria.  Genitourinary: Negative for dysuria and hematuria.  Musculoskeletal: Negative for myalgias and arthralgias.  Skin: Negative for color change.  Allergic/Immunologic: Positive for immunocompromised state (diabetic).  Neurological: Negative for weakness and numbness.  Psychiatric/Behavioral: Negative for confusion.   10 Systems reviewed and are negative for acute change except as noted in the HPI.    Allergies  Review of patient's allergies indicates no known allergies.  Home Medications   Prior to Admission medications   Medication Sig Start Date End Date Taking? Authorizing Provider  acetaminophen (TYLENOL) 500 MG tablet Take 1,000 mg by mouth every 6 (six) hours as needed (pain).     Historical Provider, MD  diazepam (VALIUM) 5 MG tablet Take 1 tablet (5 mg total) by mouth 2 (two) times daily. 01/27/14   Courtney Forcucci, PA-C  HYDROcodone-acetaminophen (NORCO/VICODIN) 5-325 MG per tablet Take 1-2 tablets by mouth every 4 (four) hours as needed. 01/21/14   Elpidio Anis, PA-C  ibuprofen (ADVIL,MOTRIN) 800 MG tablet Take 1 tablet (800 mg total) by mouth 3 (three) times daily. 03/15/14   Elpidio Anis, PA-C  metFORMIN (GLUCOPHAGE) 500 MG tablet Take 1 tablet (500 mg total) by mouth 2 (two) times daily with a meal. 08/29/13   Eber Hong, MD  metoCLOPramide (REGLAN) 10 MG tablet Take 1 tablet (10 mg total) by mouth every 6 (six) hours. 01/27/14   Courtney Forcucci, PA-C   BP 136/95 mmHg  Pulse 91  Temp(Src) 98 F (36.7 C) (Oral)  Resp 18  Ht  (1.854 m)  Wt 77.111 kg  BMI 22.43 kg/m2  SpO2 99% Physical Exam  Constitutional: He is oriented to person, place, and time. Vital signs are normal. He appears well-developed and well-nourished.  Non-toxic appearance. No distress.  Afebrile, nontoxic, NAD  HENT:  Head: Normocephalic and atraumatic.  Mouth/Throat: Oropharynx is clear and moist and mucous  membranes are normal.  Eyes: Conjunctivae and EOM are normal. Right eye exhibits no discharge. Left eye exhibits no discharge.  Neck: Normal range of motion. Neck supple.  Cardiovascular: Normal rate, regular rhythm, normal heart sounds and intact distal pulses.  Exam reveals no gallop and no friction rub.   No murmur heard. Pulmonary/Chest: Effort normal and breath sounds normal. No respiratory distress. He has no decreased breath sounds. He has no wheezes. He has no rhonchi. He has no rales.  Abdominal: Soft. Normal appearance and bowel sounds are normal. He exhibits no distension. There is no tenderness. There is no rigidity, no rebound, no guarding, no CVA tenderness, no tenderness at McBurney's point and negative Murphy's sign.  Musculoskeletal: Normal range of motion.  Neurological: He is alert and oriented to person, place, and time. He has normal strength. No sensory deficit.  Skin: Skin is warm, dry and intact. No rash noted.  Psychiatric: He has a normal mood and affect.  Nursing note and vitals reviewed.   ED Course  Procedures (including critical care time) Labs Review Labs Reviewed  CBC - Abnormal; Notable for the following:    WBC 13.3 (*)    All other components within normal limits  COMPREHENSIVE METABOLIC PANEL - Abnormal; Notable for the following:    Glucose, Bld 175 (*)    All other components within normal limits  DIFFERENTIAL - Abnormal; Notable for the following:    Lymphs Abs 4.4 (*)    Monocytes Absolute 1.7 (*)    Eosinophils Absolute 0.9 (*)    All other components within normal limits  CBG MONITORING, ED - Abnormal; Notable for the following:    Glucose-Capillary 163 (*)    All other components within normal limits  URINALYSIS, ROUTINE W REFLEX MICROSCOPIC (NOT AT [facility])    Imaging Review No results found. I have personally reviewed and evaluated these images and lab results as part of my medical decision-making.   EKG Interpretation   Date/Time:   Sunday March 31 2015 00:44:41 EST Ventricular Rate:  91 PR Interval:  142 QRS Duration: 76 QT Interval:  347 QTC Calculation: 427 R Axis:   63 Text Interpretation:  Sinus rhythm Probable left atrial enlargement TWI in  lead III No old tracing to compare Confirmed by Erroll Luna  [phone]) on 03/31/2015 4:09:56 AM      MDM   Final diagnoses:  Other fatigue  Hyperglycemia    39 y.o. male here with fatigue x1 wk. States he has lost 5lbs in 2wks. No other symptoms. States he stopped taking metformin 2 days ago, but symptoms began prior to this. No focal exam findings. Will get basic labs and reassess shortly. CBG 163, CBC with WBC 13.3, will add on differential. EKG with probable LAE and TWI in lead III but no prior EKG to compare. Awaiting remaining labs.   4:29 AM U/A clear. CMP showing gluc 175 but no anion gap or other changes. Differential showing elevated AbsLym, AbsMono, and AbsEosinophils, could be viral etiology to symptoms?. Vast differential for fatigue, but doubt any other emergent causes that need to be investigated tonight. Discussed taking  home meds as directed, staying well hydrated, and f/up with PCP in 1wk for ongoing evaluation of symptoms. I explained the diagnosis and have given explicit precautions to return to the ER including for any other new or worsening symptoms. The patient understands and accepts the medical plan as it's been dictated and I have answered their questions. Discharge instructions concerning home care and prescriptions have been given. The patient is STABLE and is discharged to home in good condition.  BP 136/95 mmHg  Pulse 91  Temp(Src) 98 F (36.7 C) (Oral)  Resp 18  Ht 6\' 1"  (1.854 m)  Wt 77.111 kg  BMI 22.43 kg/m2  SpO2 99%   Amina Menchaca Camprubi-Soms, PA-C 03/31/15 [id number]  Tomasita CrumbleAdeleke Oni, MD 03/31/15 [phone]

## 2015-06-09 ENCOUNTER — Emergency Department (HOSPITAL_COMMUNITY): Payer: BLUE CROSS/BLUE SHIELD

## 2015-06-09 ENCOUNTER — Encounter (HOSPITAL_COMMUNITY): Payer: Self-pay | Admitting: *Deleted

## 2015-06-09 ENCOUNTER — Emergency Department (HOSPITAL_COMMUNITY)
Admission: EM | Admit: 2015-06-09 | Discharge: 2015-06-09 | Disposition: A | Discharge status: Home / Self Care | Payer: BLUE CROSS/BLUE SHIELD | Attending: Emergency Medicine | Admitting: Emergency Medicine

## 2015-06-09 DIAGNOSIS — M5431 Sciatica, right side: Secondary | ICD-10-CM | POA: Insufficient documentation

## 2015-06-09 DIAGNOSIS — Z7984 Long term (current) use of oral hypoglycemic drugs: Secondary | ICD-10-CM | POA: Diagnosis not present

## 2015-06-09 DIAGNOSIS — F17200 Nicotine dependence, unspecified, uncomplicated: Secondary | ICD-10-CM | POA: Insufficient documentation

## 2015-06-09 DIAGNOSIS — E119 Type 2 diabetes mellitus without complications: Secondary | ICD-10-CM | POA: Diagnosis not present

## 2015-06-09 DIAGNOSIS — R51 Headache: Secondary | ICD-10-CM | POA: Insufficient documentation

## 2015-06-09 DIAGNOSIS — I10 Essential (primary) hypertension: Secondary | ICD-10-CM | POA: Diagnosis not present

## 2015-06-09 DIAGNOSIS — R519 Headache, unspecified: Secondary | ICD-10-CM

## 2015-06-09 LAB — CBC
HEMATOCRIT: 42.7 % (ref 39.0–52.0)
HEMOGLOBIN: 14.4 g/dL (ref 13.0–17.0)
MCH: 26.6 pg (ref 26.0–34.0)
MCHC: 33.7 g/dL (ref 30.0–36.0)
MCV: 78.9 fL (ref 78.0–100.0)
PLATELETS: 394 10*3/uL (ref 150–400)
RBC: 5.41 MIL/uL (ref 4.22–5.81)
RDW: 14 % (ref 11.5–15.5)
WBC: 11.1 10*3/uL — ABNORMAL HIGH (ref 4.0–10.5)

## 2015-06-09 LAB — I-STAT TROPONIN, ED: Troponin i, poc: 0.01 ng/mL (ref 0.00–0.08)

## 2015-06-09 LAB — BASIC METABOLIC PANEL
Anion gap: 10 (ref 5–15)
BUN: 20 mg/dL (ref 6–20)
CALCIUM: 9.2 mg/dL (ref 8.9–10.3)
CHLORIDE: 104 mmol/L (ref 101–111)
CO2: 23 mmol/L (ref 22–32)
CREATININE: 0.67 mg/dL (ref 0.61–1.24)
GFR calc non Af Amer: >60 mL/min (ref >60)
GLUCOSE: 146 mg/dL — AB (ref 65–99)
Potassium: 4 mmol/L (ref 3.5–5.1)
Sodium: 137 mmol/L (ref 135–145)

## 2015-06-09 LAB — CBG MONITORING, ED: Glucose-Capillary: 146 mg/dL — ABNORMAL HIGH (ref 65–99)

## 2015-06-09 MED ORDER — TRAMADOL HCL 50 MG PO TABS
50.0000 mg | ORAL_TABLET | Freq: Once | ORAL | Status: AC
Start: 2015-06-09 — End: 2015-06-09
  Administered 2015-06-09: 50 mg via ORAL
  Filled 2015-06-09: qty 1

## 2015-06-09 MED ORDER — TRAMADOL HCL 50 MG PO TABS: 50.0000 mg | ORAL_TABLET | Freq: Four times a day (QID) | ORAL | Status: DC | PRN

## 2015-06-09 NOTE — ED Notes (Signed)
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

## 2015-06-09 NOTE — Discharge Instructions (Signed)
Sciatica Sciatica is pain, weakness, numbness, or tingling along your sciatic nerve. The nerve starts in the lower back and runs down the back of each leg. Nerve damage or certain conditions pinch or put pressure on the sciatic nerve. This causes the pain, weakness, and other discomforts of sciatica. HOME CARE   Only take medicine as told by your doctor.  Apply ice to the affected area for 20 minutes. Do this 3-4 times a day for the first 48-72 hours. Then try heat in the same way.  Exercise, stretch, or do your usual activities if these do not make your pain worse.  Go to physical therapy as told by your doctor.  Keep all doctor visits as told.  Do not wear high heels or shoes that are not supportive.  Get a firm mattress if your mattress is too soft to lessen pain and discomfort. GET HELP RIGHT AWAY IF:   You cannot control when you poop (bowel movement) or pee (urinate).  You have more weakness in your lower back, lower belly (pelvis), butt (buttocks), or legs.  You have redness or puffiness (swelling) of your back.  You have a burning feeling when you pee.  You have pain that gets worse when you lie down.  You have pain that wakes you from your sleep.  Your pain is worse than past pain.  Your pain lasts longer than 4 weeks.  You are suddenly losing weight without reason. MAKE SURE YOU:   Understand these instructions.  Will watch this condition.  Will get help right away if you are not doing well or get worse.   This information is not intended to replace advice given to you by your health care provider. Make sure you discuss any questions you have with your health care provider.   Document Released: 12/31/2007 Document Revised: 12/12/2014 Document Reviewed: 08/02/2011 Elsevier Interactive Patient Education 2016 [employer] General Headache Without Cause A headache is pain or discomfort felt around the head or neck area. The specific cause of a headache may not  be found. There are many causes and types of headaches. A few common ones are:  Tension headaches.  Migraine headaches.  Cluster headaches.  Chronic daily headaches. HOME CARE INSTRUCTIONS  Watch your condition for any changes. Take these steps to help with your condition: Managing Pain  Take over-the-counter and prescription medicines only as told by your health care provider.  Lie down in a dark, quiet room when you have a headache.  If directed, apply ice to the head and neck area:  Put ice in a plastic bag.  Place a towel between your skin and the bag.  Leave the ice on for 20 minutes, 2-3 times per day.  Use a heating pad or hot shower to apply heat to the head and neck area as told by your health care provider.  Keep lights dim if bright lights bother you or make your headaches worse. Eating and Drinking  Eat meals on a regular schedule.  Limit alcohol use.  Decrease the amount of caffeine you drink, or stop drinking caffeine. General Instructions  Keep all follow-up visits as told by your health care provider. This is important.  Keep a headache journal to help find out what may trigger your headaches. For example, write down:  What you eat and drink.  How much sleep you get.  Any change to your diet or medicines.  Try massage or other relaxation techniques.  Limit stress.  Sit up straight,  and do not tense your muscles.  Do not use tobacco products, including cigarettes, chewing tobacco, or e-cigarettes. If you need help quitting, ask your health care provider.  Exercise regularly as told by your health care provider.  Sleep on a regular schedule. Get 7-9 hours of sleep, or the amount recommended by your health care provider. SEEK MEDICAL CARE IF:   Your symptoms are not helped by medicine.  You have a headache that is different from the usual headache.  You have nausea or you vomit.  You have a fever. SEEK IMMEDIATE MEDICAL CARE IF:   Your  headache becomes severe.  You have repeated vomiting.  You have a stiff neck.  You have a loss of vision.  You have problems with speech.  You have pain in the eye or ear.  You have muscular weakness or loss of muscle control.  You lose your balance or have trouble walking.  You feel faint or pass out.  You have confusion.   This information is not intended to replace advice given to you by your health care provider. Make sure you discuss any questions you have with your health care provider.   Document Released: 03/23/2005 Document Revised: 12/12/2014 Document Reviewed: 07/16/2014 Elsevier Interactive Patient Education [employer]

## 2015-06-09 NOTE — ED Notes (Signed)
Pt denies chest pain and headache at this time.  Pt's primary c/o is lower right sided back pain w/ radiation down right leg.

## 2015-06-09 NOTE — ED Provider Notes (Signed)
CSN: [id number]     Arrival date & time 06/09/15  0108 History   First MD Initiated Contact with Patient 06/09/15 0226     Chief Complaint  Patient presents with  . Back Pain     (Consider location/radiation/quality/duration/timing/severity/associated sxs/prior Treatment) HPI   Patient presents to the emergency department for evaluation. 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 He developed a central forehead headache that was 9/10 but has since improved greatly. He also reports having intermittent pains to the right side that shoot down towards his leg. He reports that this happens sometimes. When asking about his CP he denies having any CP, SOB or LE swelling.   Ian Moreno is a 40 y.o.  male  ROS: The patient denies diaphoresis, fever, headache, weakness (general or focal), confusion, change of vision,  dysphagia, aphagia, shortness of breath,  abdominal pains, nausea, vomiting, diarrhea, lower extremity swelling, rash, neck pain, chest pain   Past Medical History  Diagnosis Date  . Allergy   . Hypertension   . Diabetes mellitus without complication [facility](HCC)    Past Surgical History  Procedure Laterality Date  . Cholecystectomy     Family History  Problem Relation Age of Onset  . Stroke Father    Social History  Substance Use Topics  . Smoking status: Current Every Day Smoker -- 0.25 packs/day  . Smokeless tobacco: None  . Alcohol Use: No    Review of Systems  Review of Systems All other systems negative except as documented in the HPI. All pertinent positives and negatives as reviewed in the HPI.   Allergies  Review of patient's allergies indicates no known allergies.  Home Medications   Prior to Admission medications   Medication Sig Start Date End Date Taking? Authorizing Provider  acetaminophen (TYLENOL) 500 MG tablet Take 1,000 mg by mouth every 6 (six) hours as needed (pain).    Yes Historical  Provider, MD  canagliflozin (INVOKANA) 300 MG TABS tablet Take 300 mg by mouth daily before breakfast.   Yes Historical Provider, MD  metFORMIN (GLUCOPHAGE) 500 MG tablet Take 1 tablet (500 mg total) by mouth 2 (two) times daily with a meal. 08/29/13  Yes Eber HongBrian Miller, MD  traMADol (ULTRAM) 50 MG tablet Take 1 tablet (50 mg total) by mouth every 6 (six) hours as needed. 06/09/15   Zarinah Oviatt Neva SeatGreene, PA-C   BP 126/91 mmHg  Pulse 83  Temp(Src) 97.9 F (36.6 C) (Oral)  Resp 14  SpO2 100% Physical Exam  Constitutional: He appears well-developed and well-nourished. No distress.  HENT:  Head: Normocephalic and atraumatic.  Right Ear: Tympanic membrane and ear canal normal.  Left Ear: Tympanic membrane and ear canal normal.  Nose: Nose normal.  Mouth/Throat: Uvula is midline, oropharynx is clear and moist and mucous membranes are normal.  Eyes: Pupils are equal, round, and reactive to light.  Neck: Normal range of motion. Neck supple.  Cardiovascular: Normal rate and regular rhythm.   Pulmonary/Chest: Effort normal and breath sounds normal. He has no decreased breath sounds. He has no wheezes. He exhibits no tenderness and no bony tenderness.  Abdominal: Soft.  No signs of abdominal distention  Musculoskeletal:  No LE swelling Symmetrical and physiologic strength to bilateral lower extremities.  Neurosensory function adequate to both legs Skin color is normal. Skin is warm and moist.  No step off deformity appreciated and no midline bony tenderness.  Ambulatory  No crepitus,  laceration, effusion, induration, lesions Pedal pulses are symmetrical and palpable bilaterally   No tenderness to palpation of back or hips. No clonus on dorsiflextion  Neurological: He is alert.  Acting at baseline  Skin: Skin is warm and dry. No rash noted.  Nursing note and vitals reviewed.   ED Course  Procedures (including critical care time) Labs Review Labs Reviewed  BASIC METABOLIC PANEL - Abnormal;  Notable for the following:    Glucose, Bld 146 (*)    All other components within normal limits  CBC - Abnormal; Notable for the following:    WBC 11.1 (*)    All other components within normal limits  CBG MONITORING, ED - Abnormal; Notable for the following:    Glucose-Capillary 146 (*)    All other components within normal limits  I-STAT TROPOININ, ED    Imaging Review Dg Chest 2 View  06/09/2015  CLINICAL DATA:  Acute onset of mid chest pain and shortness of breath. Initial encounter. EXAM: CHEST  2 VIEW COMPARISON:  None. FINDINGS: The lungs are well-aerated and clear. There is no evidence of focal opacification, pleural effusion or pneumothorax. The heart is normal in size; the mediastinal contour is within normal limits. No acute osseous abnormalities are seen. Clips are noted within the right upper quadrant, reflecting prior cholecystectomy. IMPRESSION: No acute cardiopulmonary process seen. Electronically Signed   By: Roanna Raider M.D.   On: 06/09/2015 01:54   I have personally reviewed and evaluated these images and lab results as part of my medical decision-making.   EKG Interpretation None      MDM   Final diagnoses:  Nonintractable headache, unspecified chronicity pattern, unspecified headache type  Sciatica of right side    Patient with back pain.  No neurological deficits and normal neuro exam.  Patient is ambulatory.  No loss of bowel or bladder control.  No concern for cauda equina.  No fever, night sweats, weight loss, h/o cancer, IVDA, no recent procedure to back. No urinary symptoms suggestive of UTI.  Supportive care and return precaution discussed. Appears safe for discharge at this time. Follow up as indicated in discharge paperwork.   Triage was concerned that he was having CP but he denies to myself and the the RN [facility]   Pt HA resolved spontaneously while in ED.  Presentation is like pts typical HA and non concerning for [facility], ICH, Meningitis, or  temporal arteritis. Pt is afebrile with no focal neuro deficits, nuchal rigidity, or change in vision.  Patient with sciatic pain.  No neurological deficits and normal neuro exam.  Patient is ambulatory.  No loss of bowel or bladder control.  No concern for cauda equina.  No fever, night sweats, weight loss, h/o cancer, IVDA, no recent procedure to back. No urinary symptoms suggestive of UTI.    Rx: Ultram. Supportive care. Appears safe for discharge at this time. Follow up as indicated in discharge paperwork.  Needs to follow-up with PCP and discussed return precautions.   Marlon Pel, PA-C 06/10/15 2019  Arby Barrette, MD 06/19/15 [phone]

## 2015-09-16 ENCOUNTER — Emergency Department (HOSPITAL_COMMUNITY)
Admission: EM | Admit: 2015-09-16 | Discharge: 2015-09-16 | Disposition: A | Discharge status: Home / Self Care | Payer: BLUE CROSS/BLUE SHIELD | Attending: Emergency Medicine | Admitting: Emergency Medicine

## 2015-09-16 ENCOUNTER — Encounter (HOSPITAL_COMMUNITY): Payer: Self-pay | Admitting: Emergency Medicine

## 2015-09-16 DIAGNOSIS — E119 Type 2 diabetes mellitus without complications: Secondary | ICD-10-CM | POA: Diagnosis not present

## 2015-09-16 DIAGNOSIS — F17200 Nicotine dependence, unspecified, uncomplicated: Secondary | ICD-10-CM | POA: Insufficient documentation

## 2015-09-16 DIAGNOSIS — I10 Essential (primary) hypertension: Secondary | ICD-10-CM | POA: Insufficient documentation

## 2015-09-16 DIAGNOSIS — R519 Headache, unspecified: Secondary | ICD-10-CM

## 2015-09-16 DIAGNOSIS — R51 Headache: Secondary | ICD-10-CM | POA: Insufficient documentation

## 2015-09-16 DIAGNOSIS — Z7984 Long term (current) use of oral hypoglycemic drugs: Secondary | ICD-10-CM | POA: Insufficient documentation

## 2015-09-16 HISTORY — DX: Headache, unspecified: R51.9

## 2015-09-16 HISTORY — DX: Low back pain: M54.5

## 2015-09-16 HISTORY — DX: Other chronic pain: G89.29

## 2015-09-16 HISTORY — DX: Low back pain, unspecified: M54.50

## 2015-09-16 HISTORY — DX: Sciatica, unspecified side: M54.30

## 2015-09-16 HISTORY — DX: Headache: R51

## 2015-09-16 LAB — CBG MONITORING, ED: GLUCOSE-CAPILLARY: 110 mg/dL — AB (ref 65–99)

## 2015-09-16 MED ORDER — FLUTICASONE PROPIONATE 50 MCG/ACT NA SUSP: 2.0000 | Freq: Every day | NASAL | Status: DC

## 2015-09-16 MED ORDER — ACETAMINOPHEN 500 MG PO TABS
1000.0000 mg | ORAL_TABLET | Freq: Once | ORAL | Status: AC
  Administered 2015-09-16: 1000 mg via ORAL
  Filled 2015-09-16: qty 2

## 2015-09-16 MED ORDER — METOCLOPRAMIDE HCL 10 MG PO TABS: 10.0000 mg | ORAL_TABLET | Freq: Four times a day (QID) | ORAL | Status: DC | PRN

## 2015-09-16 MED ORDER — IBUPROFEN 400 MG PO TABS
400.0000 mg | ORAL_TABLET | Freq: Once | ORAL | Status: AC
  Administered 2015-09-16: 400 mg via ORAL
  Filled 2015-09-16: qty 1

## 2015-09-16 NOTE — Discharge Instructions (Signed)
Take over the counter tylenol and ibuprofen (OR excedrin) and benadryl, as directed on packaging, with the prescription given to you today, as needed for headache.  Keep a headache diary.  Call your regular medical doctor today to schedule a follow up appointment within the next 3  days.  Return to the Emergency Department immediately sooner if worsening.
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
## 2015-09-16 NOTE — ED Notes (Signed)
Pt. reports frontal headache onset 3 days ago , denies head injury or fever  , no nausea or photophobia .

## 2015-09-16 NOTE — ED Notes (Signed)
Checked PT CBG 110, RN BOBBY INFORMED

## 2015-10-20 ENCOUNTER — Emergency Department (HOSPITAL_COMMUNITY)
Admission: EM | Admit: 2015-10-20 | Discharge: 2015-10-20 | Disposition: A | Discharge status: Home / Self Care | Payer: BLUE CROSS/BLUE SHIELD | Attending: Emergency Medicine | Admitting: Emergency Medicine

## 2015-10-20 ENCOUNTER — Emergency Department (HOSPITAL_COMMUNITY): Payer: BLUE CROSS/BLUE SHIELD

## 2015-10-20 ENCOUNTER — Encounter (HOSPITAL_COMMUNITY): Payer: Self-pay | Admitting: *Deleted

## 2015-10-20 DIAGNOSIS — Z7984 Long term (current) use of oral hypoglycemic drugs: Secondary | ICD-10-CM | POA: Diagnosis not present

## 2015-10-20 DIAGNOSIS — F17200 Nicotine dependence, unspecified, uncomplicated: Secondary | ICD-10-CM | POA: Diagnosis not present

## 2015-10-20 DIAGNOSIS — R0602 Shortness of breath: Secondary | ICD-10-CM | POA: Diagnosis present

## 2015-10-20 DIAGNOSIS — I10 Essential (primary) hypertension: Secondary | ICD-10-CM | POA: Diagnosis not present

## 2015-10-20 DIAGNOSIS — E119 Type 2 diabetes mellitus without complications: Secondary | ICD-10-CM | POA: Insufficient documentation

## 2015-10-20 DIAGNOSIS — Z79899 Other long term (current) drug therapy: Secondary | ICD-10-CM | POA: Diagnosis not present

## 2015-10-20 LAB — CBC
HCT: 41.7 % (ref 39.0–52.0)
HEMOGLOBIN: 14 g/dL (ref 13.0–17.0)
MCH: 26.1 pg (ref 26.0–34.0)
MCHC: 33.6 g/dL (ref 30.0–36.0)
MCV: 77.7 fL — AB (ref 78.0–100.0)
Platelets: 341 10*3/uL (ref 150–400)
RBC: 5.37 MIL/uL (ref 4.22–5.81)
RDW: 14.2 % (ref 11.5–15.5)
WBC: 7.4 10*3/uL (ref 4.0–10.5)

## 2015-10-20 LAB — I-STAT TROPONIN, ED
TROPONIN I, POC: 0 ng/mL (ref 0.00–0.08)
Troponin i, poc: 0 ng/mL (ref 0.00–0.08)

## 2015-10-20 LAB — BASIC METABOLIC PANEL
ANION GAP: 10 (ref 5–15)
BUN: 12 mg/dL (ref 6–20)
CO2: 23 mmol/L (ref 22–32)
Calcium: 9 mg/dL (ref 8.9–10.3)
Chloride: 103 mmol/L (ref 101–111)
Creatinine, Ser: 0.73 mg/dL (ref 0.61–1.24)
Glucose, Bld: 133 mg/dL — ABNORMAL HIGH (ref 65–99)
POTASSIUM: 3.8 mmol/L (ref 3.5–5.1)
Sodium: 136 mmol/L (ref 135–145)

## 2015-10-20 LAB — CBG MONITORING, ED: GLUCOSE-CAPILLARY: 115 mg/dL — AB (ref 65–99)

## 2015-10-20 NOTE — ED Notes (Signed)
Pt states he woke this am feeling SHOB with numbing in lower legs.
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
## 2015-10-20 NOTE — Discharge Instructions (Signed)
Please read and follow all provided instructions.  Your diagnoses today include:  1. Shortness of breath    Tests performed today include:  Vital signs. See below for your results today.   Medications prescribed:   Take as prescribed   Home care instructions:  Follow any educational materials contained in this packet.  Follow-up instructions: Please follow-up with your primary care provider for further evaluation of symptoms and treatment   Return instructions:   Please return to the Emergency Department if you do not get better, if you get worse, or new symptoms OR  - Fever (temperature greater than 101.51F)  - Bleeding that does not stop with holding pressure to the area    -Severe pain (please note that you may be more sore the day after your accident)  - Chest Pain  - Difficulty breathing  - Severe nausea or vomiting  - Inability to tolerate food and liquids  - Passing out  - Skin becoming red around your wounds  - Change in mental status (confusion or lethargy)  - New numbness or weakness     Please return if you have any other emergent concerns.  Additional Information:  Your vital signs today were: BP 150/98 mmHg   Pulse 88   Temp(Src) 99.1 F (37.3 C) (Oral)   Resp 17   Ht 6\' 1"  (1.854 m)   Wt 77.111 kg   BMI 22.43 kg/m2   SpO2 97% If your blood pressure (BP) was elevated above 135/85 this visit, please have this repeated by your doctor within one month. ---------------

## 2015-10-20 NOTE — ED Notes (Signed)
Patient transported to X-ray 

## 2016-03-11 ENCOUNTER — Other Ambulatory Visit: Payer: Self-pay | Admitting: Nephrology

## 2016-03-11 DIAGNOSIS — B181 Chronic viral hepatitis B without delta-agent: Secondary | ICD-10-CM

## 2016-03-16 ENCOUNTER — Ambulatory Visit
Admission: RE | Admit: 2016-03-16 | Discharge: 2016-03-16 | Disposition: A | Discharge status: Home / Self Care | Payer: BLUE CROSS/BLUE SHIELD | Source: Ambulatory Visit | Attending: Nephrology | Admitting: Nephrology

## 2016-03-16 DIAGNOSIS — B181 Chronic viral hepatitis B without delta-agent: Secondary | ICD-10-CM

## 2016-03-23 ENCOUNTER — Other Ambulatory Visit (HOSPITAL_COMMUNITY): Payer: Self-pay | Admitting: Nurse Practitioner

## 2016-03-23 DIAGNOSIS — B181 Chronic viral hepatitis B without delta-agent: Secondary | ICD-10-CM

## 2016-03-31 ENCOUNTER — Other Ambulatory Visit: Payer: Self-pay | Admitting: Physician Assistant

## 2016-04-01 ENCOUNTER — Ambulatory Visit (HOSPITAL_COMMUNITY): Admission: RE | Admit: 2016-04-01 | Payer: BLUE CROSS/BLUE SHIELD | Source: Ambulatory Visit

## 2016-04-17 ENCOUNTER — Other Ambulatory Visit: Payer: Self-pay | Admitting: General Surgery

## 2016-04-17 ENCOUNTER — Other Ambulatory Visit: Payer: Self-pay | Admitting: Radiology

## 2016-04-20 ENCOUNTER — Ambulatory Visit (HOSPITAL_COMMUNITY): Admission: RE | Admit: 2016-04-20 | Payer: BLUE CROSS/BLUE SHIELD | Source: Ambulatory Visit

## 2019-07-28 ENCOUNTER — Encounter (HOSPITAL_COMMUNITY): Payer: Self-pay

## 2019-07-28 ENCOUNTER — Ambulatory Visit (HOSPITAL_COMMUNITY)
Admission: EM | Admit: 2019-07-28 | Discharge: 2019-07-28 | Disposition: A | Discharge status: Home / Self Care | Payer: BC Managed Care – PPO | Attending: Family Medicine | Admitting: Family Medicine

## 2019-07-28 ENCOUNTER — Other Ambulatory Visit: Payer: Self-pay

## 2019-07-28 DIAGNOSIS — I10 Essential (primary) hypertension: Secondary | ICD-10-CM | POA: Diagnosis not present

## 2019-07-28 MED ORDER — AMLODIPINE BESYLATE 5 MG PO TABS: 5.0000 mg | ORAL_TABLET | Freq: Every day | ORAL | 1 refills | Status: DC

## 2019-07-28 NOTE — ED Provider Notes (Signed)
[facility] CARE [facility]ENTER   [id number] 07/28/19 Arrival Time: 1320  ASSESSMENT & PLAN:  1. Uncontrolled hypertension     No s/s of hypertensive urgency.  Begin: Meds ordered this encounter  Medications  . amLODipine (NORVASC) 5 MG tablet    Sig: Take 1 tablet (5 mg total) by mouth daily.    Dispense:  30 tablet    Refill:  1    Follow-up Information    [facility].   Specialty: Urgent Care Why: In two weeks to recheck your blood pressure. Contact information: [address] [place] [address] [phone]          Reviewed expectations re: course of current medical issues. Questions answered. Outlined signs and symptoms indicating need for more acute intervention. Patient verbalized understanding. After Visit Summary given.   SUBJECTIVE:  Ian Moreno is a 44 y.o. male who presents with concerns regarding increased blood pressures. He reports that he has been treated for hypertension in the past. Years ago.  He reports no chest pain on exertion, no dyspnea on exertion, no swelling of ankles, no orthostatic dizziness or lightheadedness, no orthopnea or paroxysmal nocturnal dyspnea and no palpitations.  Denies symptoms of chest pain, palpations, orthopnea, nocturnal dyspnea, or LE edema.  Social History   Tobacco Use  Smoking Status Current Every Day Smoker  . Packs/day: 0.25   Occasional alcohol use.  Recent cold symptoms but now better.   OBJECTIVE:  Vitals:   07/28/19 1436  BP: (!) 185/107  Pulse: 100  Resp: 18  Temp: 98.7 F (37.1 C)  TempSrc: Oral  SpO2: 97%    General appearance: alert; no distress Eyes: PERRLA; EOMI HENT: normocephalic; atraumatic Neck: supple Lungs: clear to auscultation bilaterally Heart: regular rate and rhythm without murmer Abdomen: soft, non-tender Extremities: no edema; symmetrical with no gross deformities Skin: warm and dry Psychological: alert and  cooperative; normal mood and affect   No Known Allergies  Past Medical History:  Diagnosis Date  . Allergy   . Chronic headache   . Diabetes mellitus without complication (HCC)   . Hypertension   . Low back pain   . Sciatica    Social History   Socioeconomic History  . Marital status: Single    Spouse name: Not on file  . Number of children: Not on file  . Years of education: Not on file  . Highest education level: Not on file  Occupational History  . Not on file  Tobacco Use  . Smoking status: Current Every Day Smoker    Packs/day: 0.25  Substance and Sexual Activity  . Alcohol use: No  . Drug use: No  . Sexual activity: Not on file  Other Topics Concern  . Not on file  Social History Narrative  . Not on file   Social Determinants of Health   Financial Resource Strain:   . Difficulty of Paying Living Expenses:   Food Insecurity:   . Worried About Programme researcher, broadcasting/film/video in the Last Year:   . Barista in the Last Year:   Transportation Needs:   . Freight forwarder (Medical):   Marland Kitchen Lack of Transportation (Non-Medical):   Physical Activity:   . Days of Exercise per Week:   . Minutes of Exercise per Session:   Stress:   . Feeling of Stress :   Social Connections:   . Frequency of Communication with Friends and Family:   . Frequency of Social  Gatherings with Friends and Family:   . Attends Religious Services:   . Active Member of Clubs or Organizations:   . Attends Archivist Meetings:   Marland Kitchen Marital Status:   Intimate Partner Violence:   . Fear of Current or Ex-Partner:   . Emotionally Abused:   Marland Kitchen Physically Abused:   . Sexually Abused:    Family History  Problem Relation Age of Onset  . Stroke Father    Past Surgical History:  Procedure Laterality Date  . Rosaura Carpenter, MD 07/28/19 [phone]

## 2019-07-28 NOTE — ED Triage Notes (Signed)
Pt presents for elevated blood pressure.

## 2020-02-15 ENCOUNTER — Other Ambulatory Visit: Payer: Self-pay

## 2020-02-15 ENCOUNTER — Emergency Department (HOSPITAL_COMMUNITY)
Admission: EM | Admit: 2020-02-15 | Discharge: 2020-02-15 | Disposition: A | Discharge status: Home / Self Care | Payer: BC Managed Care – PPO | Attending: Emergency Medicine | Admitting: Emergency Medicine

## 2020-02-15 ENCOUNTER — Encounter (HOSPITAL_COMMUNITY): Payer: Self-pay | Admitting: Emergency Medicine

## 2020-02-15 ENCOUNTER — Emergency Department (HOSPITAL_COMMUNITY): Payer: BC Managed Care – PPO

## 2020-02-15 DIAGNOSIS — E119 Type 2 diabetes mellitus without complications: Secondary | ICD-10-CM | POA: Insufficient documentation

## 2020-02-15 DIAGNOSIS — R42 Dizziness and giddiness: Secondary | ICD-10-CM | POA: Insufficient documentation

## 2020-02-15 DIAGNOSIS — Z79899 Other long term (current) drug therapy: Secondary | ICD-10-CM | POA: Insufficient documentation

## 2020-02-15 DIAGNOSIS — Z7984 Long term (current) use of oral hypoglycemic drugs: Secondary | ICD-10-CM | POA: Insufficient documentation

## 2020-02-15 DIAGNOSIS — F17200 Nicotine dependence, unspecified, uncomplicated: Secondary | ICD-10-CM | POA: Insufficient documentation

## 2020-02-15 DIAGNOSIS — I10 Essential (primary) hypertension: Secondary | ICD-10-CM | POA: Insufficient documentation

## 2020-02-15 LAB — CBC
HCT: 39.6 % (ref 39.0–52.0)
Hemoglobin: 13.2 g/dL (ref 13.0–17.0)
MCH: 27.1 pg (ref 26.0–34.0)
MCHC: 33.3 g/dL (ref 30.0–36.0)
MCV: 81.3 fL (ref 80.0–100.0)
Platelets: 320 10*3/uL (ref 150–400)
RBC: 4.87 MIL/uL (ref 4.22–5.81)
RDW: 13.9 % (ref 11.5–15.5)
WBC: 7.3 10*3/uL (ref 4.0–10.5)
nRBC: 0 % (ref 0.0–0.2)

## 2020-02-15 LAB — BASIC METABOLIC PANEL
Anion gap: 10 (ref 5–15)
BUN: 11 mg/dL (ref 6–20)
CO2: 25 mmol/L (ref 22–32)
Calcium: 9.1 mg/dL (ref 8.9–10.3)
Chloride: 101 mmol/L (ref 98–111)
Creatinine, Ser: 0.7 mg/dL (ref 0.61–1.24)
GFR, Estimated: >60 mL/min (ref >60)
Glucose, Bld: 124 mg/dL — ABNORMAL HIGH (ref 70–99)
Potassium: 4.4 mmol/L (ref 3.5–5.1)
Sodium: 136 mmol/L (ref 135–145)

## 2020-02-15 LAB — CBG MONITORING, ED: Glucose-Capillary: 92 mg/dL (ref 70–99)

## 2020-02-15 MED ORDER — SODIUM CHLORIDE 0.9 % IV SOLN: INTRAVENOUS | Status: DC

## 2020-02-15 MED ORDER — SODIUM CHLORIDE 0.9 % IV BOLUS
500.0000 mL | Freq: Once | INTRAVENOUS | Status: AC
  Administered 2020-02-15: 500 mL via INTRAVENOUS

## 2020-02-15 NOTE — ED Provider Notes (Signed)
[facility]-EMERGENCY DEPT Provider Note   CSN: [id number] Arrival date & time: 02/15/20  1203     History Chief Complaint  Patient presents with  . Dizziness  . Hypertension    Ian Moreno is a 44 y.o. male.  Patient with a known history of hypertension and diabetes.  Patient is on Norvasc for the blood pressure and Glucophage for the diabetes.  States he takes his Norvasc at nighttime instead of first thing in the morning.  Also admits that he has not been consistent with taking it every day.  Patient stated that he had the second Covid shot on October 27 and has had dizziness feeling since then.  Gets a little feeling of moving around when he closes eyes.  But no chest pain no shortness of breath no headache.  No numbness or weakness.  No visual changes.  Patient states that the dizziness is intermittent.  Currently not having any at this moment.        Past Medical History:  Diagnosis Date  . Allergy   . Chronic headache   . Diabetes mellitus without complication (HCC)   . Hypertension   . Low back pain   . Sciatica     There are no problems to display for this patient.   Past Surgical History:  Procedure Laterality Date  . CHOLECYSTECTOMY         Family History  Problem Relation Age of Onset  . Stroke Father     Social History   Tobacco Use  . Smoking status: Current Every Day Smoker    Packs/day: 0.25  Substance Use Topics  . Alcohol use: No  . Drug use: No    Home Medications Prior to Admission medications   Medication Sig Start Date End Date Taking? Authorizing Provider  acetaminophen (TYLENOL) 500 MG tablet Take 1,000 mg by mouth every 6 (six) hours as needed for moderate pain.    Yes [provider]  amlodipine-olmesartan (AZOR) 10-20 MG tablet Take 1 tablet by mouth daily. 10/19/19  Yes [provider]  ibuprofen (ADVIL) 200 MG tablet Take 400 mg by mouth every 6 (six) hours as needed for moderate pain.    Yes [provider]  metFORMIN (GLUCOPHAGE) 500 MG tablet Take 1 tablet (500 mg total) by mouth 2 (two) times daily with a meal. 08/29/13  Yes Eber Hong, MD  tiZANidine (ZANAFLEX) 2 MG tablet Take 2 mg by mouth every 6 (six) hours as needed for muscle spasms.   Yes [provider]  amLODipine (NORVASC) 5 MG tablet Take 1 tablet (5 mg total) by mouth daily. Patient not taking: Reported on 02/15/2020 07/28/19   Mardella Layman, MD    Allergies    Patient has no known allergies.  Review of Systems   Review of Systems  Constitutional: Negative for chills and fever.  HENT: Negative for congestion, rhinorrhea and sore throat.   Eyes: Negative for visual disturbance.  Respiratory: Negative for cough and shortness of breath.   Cardiovascular: Negative for chest pain and leg swelling.  Gastrointestinal: Negative for abdominal pain, diarrhea, nausea and vomiting.  Genitourinary: Negative for dysuria.  Musculoskeletal: Negative for back pain and neck pain.  Skin: Negative for rash.  Neurological: Positive for dizziness. Negative for light-headedness and headaches.  Hematological: Does not bruise/bleed easily.  Psychiatric/Behavioral: Negative for confusion.    Physical Exam Updated Vital Signs BP (!) 155/112   Pulse 67   Temp 98.1 F (36.7 C) (Oral)  Resp (!) 25   SpO2 100%   Physical Exam Vitals and nursing note reviewed.  Constitutional:      Appearance: Normal appearance. He is well-developed.  HENT:     Head: Normocephalic and atraumatic.  Eyes:     Extraocular Movements: Extraocular movements intact.     Conjunctiva/sclera: Conjunctivae normal.     Pupils: Pupils are equal, round, and reactive to light.  Cardiovascular:     Rate and Rhythm: Normal rate and regular rhythm.     Heart sounds: No murmur heard.   Pulmonary:     Effort: Pulmonary effort is normal. No respiratory distress.     Breath sounds: Normal breath sounds.  Abdominal:     Palpations:  Abdomen is soft.     Tenderness: There is no abdominal tenderness.  Musculoskeletal:        General: Normal range of motion.     Cervical back: Normal range of motion and neck supple.  Skin:    General: Skin is warm and dry.     Capillary Refill: Capillary refill takes less than 2 seconds.  Neurological:     General: No focal deficit present.     Mental Status: He is alert and oriented to person, place, and time.     Cranial Nerves: No cranial nerve deficit.     Sensory: No sensory deficit.     Motor: No weakness.     Coordination: Coordination normal.     ED Results / Procedures / Treatments   Labs (all labs ordered are listed, but only abnormal results are displayed) Labs Reviewed  BASIC METABOLIC PANEL - Abnormal; Notable for the following components:      Result Value   Glucose, Bld 124 (*)    All other components within normal limits  CBC  CBG MONITORING, ED    EKG EKG Interpretation  Date/Time:  Thursday February 15 2020 17:24:44 EST Ventricular Rate:  74 PR Interval:    QRS Duration: 86 QT Interval:  416 QTC Calculation: 462 R Axis:   52 Text Interpretation: Sinus rhythm Consider left ventricular hypertrophy Confirmed by Vanetta Mulders [phone]) on 02/15/2020 5:28:59 PM   Radiology CT Head Wo Contrast  Result Date: 02/15/2020 CLINICAL DATA:  44 year old presenting with dizziness and hypertension since the patient received his second COVID-19 vaccine on 01/31/2020. EXAM: CT HEAD WITHOUT CONTRAST TECHNIQUE: Contiguous axial images were obtained from the base of the skull through the vertex without intravenous contrast. COMPARISON:  None. FINDINGS: Brain: Ventricular system normal in size and appearance for age. No mass lesion. No midline shift. No acute hemorrhage or hematoma. No extra-axial fluid collections. No evidence of acute infarction. No focal brain parenchymal abnormalities. Vascular: No hyperdense vessel.  No visible atherosclerosis. Skull: No skull  fracture or other focal osseous abnormality involving the skull. Sinuses/Orbits: Visualized paranasal sinuses, bilateral mastoid air cells and bilateral middle ear cavities well-aerated. Visualized orbits and globes normal in appearance. Other: None. IMPRESSION: Normal examination. Electronically Signed   By: Hulan Saas M.D.   On: 02/15/2020 19:12    Procedures Procedures (including critical care time)  Medications Ordered in ED Medications  0.9 %  sodium chloride infusion ( Intravenous New Bag/Given 02/15/20 1733)  sodium chloride 0.9 % bolus 500 mL (0 mLs Intravenous Stopped 02/15/20 1732)    ED Course  I have reviewed the triage vital signs and the nursing notes.  Pertinent labs & imaging results that were available during my care of the patient were reviewed by me  and considered in my medical decision making (see chart for details).    MDM Rules/Calculators/A&P                          Work-up here labs without significant abnormalities.  Head CT negative.  Blood pressure is elevated.  Would recommend start taking your Norvasc first thing in the morning instead of at nighttime.  Record your blood pressure daily.  Make an appointment to follow-up with your primary care doctor for recheck.  Patient nontoxic no acute distress.  Patient states that the dizziness has resolved currently.   Final Clinical Impression(s) / ED Diagnoses Final diagnoses:  Primary hypertension    Rx / DC Orders ED Discharge Orders    None       Vanetta Mulders, MD 02/15/20 2015

## 2020-02-15 NOTE — ED Notes (Signed)
CT at bedside 

## 2020-02-15 NOTE — Discharge Instructions (Addendum)
Resume taking your Norvasc daily as prescribed.  Make sure you take it each day.  Keep a daily log of your blood pressure.  They can appointment with your primary care doctor to be seen.  Work note provided to be out of work the next 2 days.  Head CT negative labs without significant abnormalities.  Return for any worse dizziness or worse headache or any chest pain or shortness of breath.  It would be best to take your blood pressure medicine in the morning.  Says blood pressure tends to be higher during the daytime.  Naturally gets lower at night.

## 2020-02-15 NOTE — ED Triage Notes (Signed)
Pt reports got 2nd Covid shot 10/27. Ever since had dizziness with moving around and BP been high and not controlled by medications. Pt reports takes his HTM meds at night and hasnt taken today yet.

## 2020-02-15 NOTE — ED Notes (Signed)
Assumed care of patient at this time, nad noted, sr up x2, bed locked and low, call bell w/I reach.  Will continue to monitor. ° °

## 2021-04-15 ENCOUNTER — Encounter (HOSPITAL_COMMUNITY): Payer: Self-pay

## 2021-04-15 ENCOUNTER — Other Ambulatory Visit: Payer: Self-pay

## 2021-04-15 ENCOUNTER — Ambulatory Visit (HOSPITAL_COMMUNITY)
Admission: EM | Admit: 2021-04-15 | Discharge: 2021-04-15 | Disposition: A | Discharge status: Home / Self Care | Payer: BC Managed Care – PPO | Attending: Family Medicine | Admitting: Family Medicine

## 2021-04-15 DIAGNOSIS — I10 Essential (primary) hypertension: Secondary | ICD-10-CM

## 2021-04-15 DIAGNOSIS — E1165 Type 2 diabetes mellitus with hyperglycemia: Secondary | ICD-10-CM

## 2021-04-15 DIAGNOSIS — E1159 Type 2 diabetes mellitus with other circulatory complications: Secondary | ICD-10-CM | POA: Diagnosis not present

## 2021-04-15 DIAGNOSIS — E0865 Diabetes mellitus due to underlying condition with hyperglycemia: Secondary | ICD-10-CM

## 2021-04-15 LAB — CBG MONITORING, ED: Glucose-Capillary: 185 mg/dL — ABNORMAL HIGH (ref 70–99)

## 2021-04-15 MED ORDER — METFORMIN HCL 500 MG PO TABS: 500.0000 mg | ORAL_TABLET | Freq: Two times a day (BID) | ORAL | 2 refills | Status: DC

## 2021-04-15 MED ORDER — AMLODIPINE-OLMESARTAN 10-20 MG PO TABS: 1.0000 | ORAL_TABLET | Freq: Every day | ORAL | 2 refills | Status: AC

## 2021-04-15 NOTE — ED Triage Notes (Signed)
Pt presents with c/o CBG of 200 on Friday night. Pt requesting glucose check. Pt denies HA or dizziness r/t blood pressure. Pt states he has hx of HTN and is not taking medication at this time

## 2021-04-16 NOTE — ED Provider Notes (Signed)
[facility]ARE CENTER   [id number] 04/15/21 Arrival Time: 1536  ASSESSMENT & PLAN:  1. Diabetes mellitus due to underlying condition with hyperglycemia, without long-term current use of insulin (HCC)   2. Uncontrolled hypertension    Refilled and clarified dosing of: Meds ordered this encounter  Medications   amlodipine-olmesartan (AZOR) 10-20 MG tablet    Sig: Take 1 tablet by mouth daily.    Dispense:  30 tablet    Refill:  2   metFORMIN (GLUCOPHAGE) 500 MG tablet    Sig: Take 1 tablet (500 mg total) by mouth 2 (two) times daily with a meal.    Dispense:  60 tablet    Refill:  2   Without s/s of HTN urgency.  Labs Reviewed  CBG MONITORING, ED - Abnormal; Notable for the following components:      Result Value   Glucose-Capillary 185 (*)    All other components within normal limits   Recommend:  Follow-up Information     Schedule an appointment as soon as possible for a visit  with Jackie Plum, MD.   Specialty: Internal Medicine Contact information: [address] [facility] [address] [phone]                 Reviewed expectations re: course of current medical issues. Questions answered. Outlined signs and symptoms indicating need for more acute intervention. Understanding verbalized. After Visit Summary given.   SUBJECTIVE: History from: patient. Ian Moreno is a 46 y.o. male who reports high blood pressures. On/off medication use. Has noted elevated CBG also. Overall feeling well though. Denies: headache. Normal PO intake without n/v/d. He reports no chest pain on exertion, no dyspnea on exertion, no swelling of ankles, no orthostatic dizziness or lightheadedness, no orthopnea or paroxysmal nocturnal dyspnea, no palpitations, and no intermittent claudication symptoms.   OBJECTIVE:  Vitals:   04/15/21 1630  BP: (!) 197/101  Pulse: 100  Resp: 14  Temp: 98.7 F (37.1 C)  TempSrc: Oral  SpO2: 100%    General appearance: alert;  no distress Eyes: PERRLA; EOMI; conjunctiva normal HENT: [place]; AT Neck: supple  Lungs: speaks full sentences without difficulty; unlabored Extremities: no edema Skin: warm and dry Neurologic: normal gait Psychological: alert and cooperative; normal mood and affect  Labs: Results for orders placed or performed during the hospital encounter of 04/15/21  POC CBG monitoring  Result Value Ref Range   Glucose-Capillary 185 (H) 70 - 99 mg/dL   Labs Reviewed  CBG MONITORING, ED - Abnormal; Notable for the following components:      Result Value   Glucose-Capillary 185 (*)    All other components within normal limits    No Known Allergies  Past Medical History:  Diagnosis Date   Allergy    Chronic headache    Diabetes mellitus without complication (HCC)    Hypertension    Low back pain    Sciatica    Social History   Socioeconomic History   Marital status: Single    Spouse name: Not on file   Number of children: Not on file   Years of education: Not on file   Highest education level: Not on file  Occupational History   Not on file  Tobacco Use   Smoking status: Every Day    Packs/day: 0.25    Types: Cigarettes   Smokeless tobacco: Not on file  Substance and Sexual Activity   Alcohol use: No   Drug use: No   Sexual activity: Not  on file  Other Topics Concern   Not on file  Social History Narrative   Not on file   Social Determinants of Health   Financial Resource Strain: Not on file  Food Insecurity: Not on file  Transportation Needs: Not on file  Physical Activity: Not on file  Stress: Not on file  Social Connections: Not on file  Intimate Partner Violence: Not on file   Family History  Problem Relation Age of Onset   Stroke Father    Past Surgical History:  Procedure Laterality Date   Barrington Ellison, MD 04/16/21 [phone]

## 2021-08-18 ENCOUNTER — Encounter (HOSPITAL_COMMUNITY): Payer: Self-pay

## 2021-08-18 ENCOUNTER — Ambulatory Visit (HOSPITAL_COMMUNITY)
Admission: EM | Admit: 2021-08-18 | Discharge: 2021-08-18 | Disposition: A | Discharge status: Home / Self Care | Payer: BC Managed Care – PPO

## 2021-08-18 DIAGNOSIS — H00011 Hordeolum externum right upper eyelid: Secondary | ICD-10-CM

## 2021-08-18 NOTE — ED Provider Notes (Addendum)
?[facility] ? ? ? ?CSN: [id number] ?Arrival date & time: 08/18/21  1824 ? ? ?  ? ?History   ?Chief Complaint ?Chief Complaint  ?Patient presents with  ? Eye Pain  ? ? ?HPI ?Ian Moreno is a 46 y.o. male.  ? ?Pt complains of a nontender bump to his right upper eyelid that started about one week ago.  He denies visual changes, eye discharge.  He has tried eye drops with no relief.  He does not wear contacts or glasses. Reports he missed a few days of work last week due to eye irritation.  ? ?Pt with uncontrolled HTN.  He reports he did not take his BP medication today.  Denies chest pain, headache, lower extremity swelling, shortness of breath, palpitations.   ?BP Readings from Last 3 Encounters: ?08/18/21 : (!) 170/100 ?04/15/21 : (!) 197/101 ?02/15/20 : (!) 177/103 ? ? ? ? ?Past Medical History:  ?Diagnosis Date  ? Allergy   ? Chronic headache   ? Diabetes mellitus without complication ([place])   ? Hypertension   ? Low back pain   ? Sciatica   ? ? ?There are no problems to display for this patient. ? ? ?Past Surgical History:  ?Procedure Laterality Date  ? CHOLECYSTECTOMY    ? ? ? ? ? ?Home Medications   ? ?Prior to Admission medications   ?Medication Sig Start Date End Date Taking? Authorizing Provider  ?acetaminophen (TYLENOL) 500 MG tablet Take 1,000 mg by mouth every 6 (six) hours as needed for moderate pain.  ?Patient not taking: Reported on 04/15/2021    [provider]  ?amlodipine-olmesartan (AZOR) 10-20 MG tablet Take 1 tablet by mouth daily. 04/15/21   Vanessa Kick, MD  ?ibuprofen (ADVIL) 200 MG tablet Take 400 mg by mouth every 6 (six) hours as needed for moderate pain. ?Patient not taking: Reported on 04/15/2021    [provider]  ?metFORMIN (GLUCOPHAGE) 500 MG tablet Take 1 tablet (500 mg total) by mouth 2 (two) times daily with a meal. 04/15/21   Vanessa Kick, MD  ?tiZANidine (ZANAFLEX) 2 MG tablet Take 2 mg by mouth every 6 (six) hours as needed for muscle spasms. ?Patient not  taking: Reported on 04/15/2021    [provider]  ? ? ?Family History ?Family History  ?Problem Relation Age of Onset  ? Stroke Father   ? ? ?Social History ?Social History  ? ?Tobacco Use  ? Smoking status: Every Day  ?  Packs/day: 0.25  ?  Types: Cigarettes  ?Substance Use Topics  ? Alcohol use: No  ? Drug use: No  ? ? ? ?Allergies   ?Patient has no known allergies. ? ? ?Review of Systems ?Review of Systems  ?Constitutional:  Negative for chills and fever.  ?HENT:  Negative for ear pain and sore throat.   ?Eyes:  Negative for pain and visual disturbance.  ?     Bump to the right upper eyelid  ?Respiratory:  Negative for cough and shortness of breath.   ?Cardiovascular:  Negative for chest pain and palpitations.  ?Gastrointestinal:  Negative for abdominal pain and vomiting.  ?Genitourinary:  Negative for dysuria and hematuria.  ?Musculoskeletal:  Negative for arthralgias and back pain.  ?Skin:  Negative for color change and rash.  ?Neurological:  Negative for seizures and syncope.  ?All other systems reviewed and are negative. ? ? ?Physical Exam ?Triage Vital Signs ?ED Triage Vitals  ?Enc Vitals Group  ?   BP 08/18/21 1929 Marland Kitchen)  170/100  ?   Pulse Rate 08/18/21 1929 (!) 108  ?   Resp 08/18/21 1929 18  ?   Temp 08/18/21 1929 98.7 ?F (37.1 ?C)  ?   Temp Source 08/18/21 1929 Oral  ?   SpO2 08/18/21 1929 100 %  ?   Weight --   ?   Height --   ?   Head Circumference --   ?   Peak Flow --   ?   Pain Score 08/18/21 1931 5  ?   Pain Loc --   ?   Pain Edu? --   ?   Excl. in [place]? --   ? ?No data found. ? ?Updated Vital Signs ?BP (!) 170/100 (BP Location: Left Arm)   Pulse (!) 108   Temp 98.7 ?F (37.1 ?C) (Oral)   Resp 18   SpO2 100%  ? ?Visual Acuity ?Right Eye Distance:   ?Left Eye Distance:   ?Bilateral Distance:   ? ?Right Eye Near:   ?Left Eye Near:    ?Bilateral Near:    ? ?Physical Exam ?Vitals and nursing note reviewed.  ?Constitutional:   ?   General: He is not in acute distress. ?   Appearance: He is  well-developed.  ?HENT:  ?   Head: Normocephalic and atraumatic.  ?Eyes:  ?   Conjunctiva/sclera: Conjunctivae normal.  ? ?Cardiovascular:  ?   Rate and Rhythm: Normal rate and regular rhythm.  ?   Heart sounds: No murmur heard. ?Pulmonary:  ?   Effort: Pulmonary effort is normal. No respiratory distress.  ?   Breath sounds: Normal breath sounds.  ?Abdominal:  ?   Palpations: Abdomen is soft.  ?   Tenderness: There is no abdominal tenderness.  ?Musculoskeletal:     ?   General: No swelling.  ?   Cervical back: Neck supple.  ?Skin: ?   General: Skin is warm and dry.  ?   Capillary Refill: Capillary refill takes less than 2 seconds.  ?Neurological:  ?   Mental Status: He is alert.  ?Psychiatric:     ?   Mood and Affect: Mood normal.  ? ? ? ?UC Treatments / Results  ?Labs ?(all labs ordered are listed, but only abnormal results are displayed) ?Labs Reviewed - No data to display ? ?EKG ? ? ?Radiology ?No results found. ? ?Procedures ?Procedures (including critical care time) ? ?Medications Ordered in UC ?Medications - No data to display ? ?Initial Impression / Assessment and Plan / UC Course  ?I have reviewed the triage vital signs and the nursing notes. ? ?Pertinent labs & imaging results that were available during my care of the patient were reviewed by me and considered in my medical decision making (see chart for details). ? ?  ? ?Non painful stye to right upper eyelid.  Advised warm compress.  Pt requests a work note. Discussed importance of taking HTN medication.  Pt asymptomatic at this time.  ED precautions given.  ?Final Clinical Impressions(s) / UC Diagnoses  ? ?Final diagnoses:  ?Hordeolum externum of right upper eyelid  ? ? ? ?Discharge Instructions   ? ?  ?Recommend warm compress ?Can take Ibuprofen as needed for discomfort ? ? ? ? ?ED Prescriptions   ?None ?  ? ?PDMP not reviewed this encounter. ?  ?Ward, Lenise Arena, PA-C ?08/18/21 2018 ? ?  ?Ward, Lenise Arena, PA-C ?08/18/21 2018 ? ?

## 2021-08-18 NOTE — Discharge Instructions (Addendum)
Recommend warm compress ?Can take Ibuprofen as needed for discomfort ?

## 2021-08-18 NOTE — ED Triage Notes (Signed)
Pt c/o a bump to rt upper eye lid x1wk. States use eye drops with no relief. Denies blurred vision. ?

## 2022-03-11 ENCOUNTER — Emergency Department (HOSPITAL_COMMUNITY): Payer: Self-pay

## 2022-03-11 ENCOUNTER — Emergency Department (HOSPITAL_COMMUNITY)
Admission: EM | Admit: 2022-03-11 | Discharge: 2022-03-12 | Disposition: A | Discharge status: Home / Self Care | Payer: Self-pay | Attending: Emergency Medicine | Admitting: Emergency Medicine

## 2022-03-11 ENCOUNTER — Other Ambulatory Visit: Payer: Self-pay

## 2022-03-11 DIAGNOSIS — R739 Hyperglycemia, unspecified: Secondary | ICD-10-CM

## 2022-03-11 DIAGNOSIS — T65891A Toxic effect of other specified substances, accidental (unintentional), initial encounter: Secondary | ICD-10-CM | POA: Insufficient documentation

## 2022-03-11 DIAGNOSIS — R7401 Elevation of levels of liver transaminase levels: Secondary | ICD-10-CM

## 2022-03-11 DIAGNOSIS — I10 Essential (primary) hypertension: Secondary | ICD-10-CM | POA: Insufficient documentation

## 2022-03-11 LAB — CBC WITH DIFFERENTIAL/PLATELET
Abs Immature Granulocytes: 0.02 10*3/uL (ref 0.00–0.07)
Basophils Absolute: 0.1 10*3/uL (ref 0.0–0.1)
Basophils Relative: 1 %
Eosinophils Absolute: 0.5 10*3/uL (ref 0.0–0.5)
Eosinophils Relative: 7 %
HCT: 40 % (ref 39.0–52.0)
Hemoglobin: 13.7 g/dL (ref 13.0–17.0)
Immature Granulocytes: 0 %
Lymphocytes Relative: 21 %
Lymphs Abs: 1.6 10*3/uL (ref 0.7–4.0)
MCH: 26.9 pg (ref 26.0–34.0)
MCHC: 34.3 g/dL (ref 30.0–36.0)
MCV: 78.6 fL — ABNORMAL LOW (ref 80.0–100.0)
Monocytes Absolute: 0.7 10*3/uL (ref 0.1–1.0)
Monocytes Relative: 9 %
Neutro Abs: 4.6 10*3/uL (ref 1.7–7.7)
Neutrophils Relative %: 62 %
Platelets: 353 10*3/uL (ref 150–400)
RBC: 5.09 MIL/uL (ref 4.22–5.81)
RDW: 13.5 % (ref 11.5–15.5)
WBC: 7.4 10*3/uL (ref 4.0–10.5)
nRBC: 0 % (ref 0.0–0.2)

## 2022-03-11 LAB — COMPREHENSIVE METABOLIC PANEL
ALT: 54 U/L — ABNORMAL HIGH (ref 0–44)
AST: 64 U/L — ABNORMAL HIGH (ref 15–41)
Albumin: 3.6 g/dL (ref 3.5–5.0)
Alkaline Phosphatase: 90 U/L (ref 38–126)
Anion gap: 12 (ref 5–15)
BUN: 11 mg/dL (ref 6–20)
CO2: 22 mmol/L (ref 22–32)
Calcium: 8.7 mg/dL — ABNORMAL LOW (ref 8.9–10.3)
Chloride: 100 mmol/L (ref 98–111)
Creatinine, Ser: 1 mg/dL (ref 0.61–1.24)
GFR, Estimated: >60 mL/min (ref >60)
Glucose, Bld: 302 mg/dL — ABNORMAL HIGH (ref 70–99)
Potassium: 3.8 mmol/L (ref 3.5–5.1)
Sodium: 134 mmol/L — ABNORMAL LOW (ref 135–145)
Total Bilirubin: 0.3 mg/dL (ref 0.3–1.2)
Total Protein: 6.9 g/dL (ref 6.5–8.1)

## 2022-03-11 NOTE — ED Provider Triage Note (Signed)
Emergency Medicine Provider Triage Evaluation Note  Ian Moreno , a 46 y.o. male  was evaluated in triage.  Pt complains of bleach exposure. Pt accidentally took a sip of diluted bleach 20 mins ago when he was washing his clothes.  Did spit it out but felt discomfort in mouth.  Does have hx of HTN but not on BP medications.  Found to have high BP here  Review of Systems  Positive: As above Negative: As above  Physical Exam  BP (!) 191/111 (BP Location: Right Arm)   Pulse (!) 101   Temp 98.9 F (37.2 C)   Resp 16   SpO2 95%  Gen:   Awake, no distress   Resp:  Normal effort  MSK:   Moves extremities without difficulty  Other:    Medical Decision Making  Medically screening exam initiated at 6:37 PM.  Appropriate orders placed.  Ian Moreno was informed that the remainder of the evaluation will be completed by another provider, this initial triage assessment does not replace that evaluation, and the importance of remaining in the ED until their evaluation is complete.     Fayrene Helper, PA-C 03/11/22 1840

## 2022-03-11 NOTE — ED Triage Notes (Signed)
Pt BIB EMS from home.  Called out for someone putting bleach in their mouth accidentally.  Pt did not swallow any bleach. BP was 230/120 that is now 200/110.  Pt has been off his metformin and BP meds x 1 month.  CBG 309.

## 2022-03-11 NOTE — ED Notes (Signed)
Patient called to reassess vitals 3x. No answer.

## 2022-03-12 MED ORDER — METFORMIN HCL 500 MG PO TABS
500.0000 mg | ORAL_TABLET | Freq: Two times a day (BID) | ORAL | Status: AC
  Administered 2022-03-12: 500 mg via ORAL
  Filled 2022-03-12: qty 1

## 2022-03-12 MED ORDER — IRBESARTAN 300 MG PO TABS
150.0000 mg | ORAL_TABLET | Freq: Once | ORAL | Status: AC
  Administered 2022-03-12: 150 mg via ORAL
  Filled 2022-03-12: qty 1

## 2022-03-12 MED ORDER — ALUM & MAG HYDROXIDE-SIMETH 200-200-20 MG/5ML PO SUSP
15.0000 mL | Freq: Once | ORAL | Status: AC
  Administered 2022-03-12: 15 mL via ORAL
  Filled 2022-03-12: qty 30

## 2022-03-12 MED ORDER — AMLODIPINE BESYLATE 5 MG PO TABS
10.0000 mg | ORAL_TABLET | Freq: Once | ORAL | Status: AC
  Administered 2022-03-12: 10 mg via ORAL
  Filled 2022-03-12: qty 2

## 2022-03-12 MED ORDER — AMLODIPINE-OLMESARTAN 10-20 MG PO TABS: 1.0000 | ORAL_TABLET | Freq: Every day | ORAL | Status: DC

## 2022-03-12 NOTE — ED Notes (Signed)
Pt understands d/c teaching. Pt D/C to lobby waiting for taxi

## 2022-03-12 NOTE — ED Notes (Signed)
Called patient to reassess vitals x3. No answer.

## 2022-03-12 NOTE — Discharge Instructions (Addendum)
Take your metformin and blood pressure medication as prescribed.  We recommend follow-up with your primary care doctor for recheck of your blood pressure and blood sugar.  Your liver tests were very mildly elevated and should also be rechecked by your doctor.  Return to the ED for new or concerning symptoms.

## 2022-04-07 NOTE — ED Provider Notes (Signed)
[facility] Provider Note   CSN: [id number] Arrival date & time: 03/11/22  1752     History  Chief Complaint  Patient presents with   Hypertension    Ian Moreno is a 47 y.o. male.  48 y/o male present to the ED for evaluation after ingesting bleach. He states that he took a sip of diluted bleach, which he thought was water, when he was washing his clothes. He never swallowed the bleach; instead spit it out when he realized he drank from the wrong bottle. Notes some ongoing discomfort in his mouth. Has had no trouble swallowing. Denies CP, SOB, vomiting. Patient found to be hypertensive on arrival, but has been noncompliant with his BP medications.  The history is provided by the patient. No language interpreter was used.  Hypertension       Home Medications Prior to Admission medications   Medication Sig Start Date End Date Taking? Authorizing Provider  acetaminophen (TYLENOL) 500 MG tablet Take 1,000 mg by mouth every 6 (six) hours as needed for moderate pain.  Patient not taking: Reported on 04/15/2021    [provider]  amlodipine-olmesartan (AZOR) 10-20 MG tablet Take 1 tablet by mouth daily. 04/15/21   Vanessa Kick, MD  ibuprofen (ADVIL) 200 MG tablet Take 400 mg by mouth every 6 (six) hours as needed for moderate pain. Patient not taking: Reported on 04/15/2021    [provider]  metFORMIN (GLUCOPHAGE) 500 MG tablet Take 1 tablet (500 mg total) by mouth 2 (two) times daily with a meal. 04/15/21   Vanessa Kick, MD  tiZANidine (ZANAFLEX) 2 MG tablet Take 2 mg by mouth every 6 (six) hours as needed for muscle spasms. Patient not taking: Reported on 04/15/2021    [provider]      Allergies    Patient has no known allergies.    Review of Systems   Review of Systems Ten systems reviewed and are negative for acute change, except as noted in the HPI.    Physical Exam Updated Vital Signs BP (!) 175/98  (BP Location: Right Arm)   Pulse 75   Temp 98 F (36.7 C) (Oral)   Resp 16   SpO2 100%   Physical Exam Vitals and nursing note reviewed.  Constitutional:      General: He is not in acute distress.    Appearance: He is well-developed. He is not diaphoretic.     Comments: Nontoxic appearing and in NAD  HENT:     Head: Normocephalic and atraumatic.     Mouth/Throat:     Comments: Tolerating secretions without difficulty. Clear oropharynx. Eyes:     General: No scleral icterus.    Conjunctiva/sclera: Conjunctivae normal.  Cardiovascular:     Rate and Rhythm: Normal rate and regular rhythm.     Pulses: Normal pulses.  Pulmonary:     Effort: Pulmonary effort is normal. No respiratory distress.     Comments: Respirations even and unlabored. Lungs CTAB. Musculoskeletal:        General: Normal range of motion.     Cervical back: Normal range of motion.  Skin:    General: Skin is warm and dry.     Coloration: Skin is not pale.     Findings: No erythema or rash.  Neurological:     Mental Status: He is alert and oriented to person, place, and time.     Coordination: Coordination normal.  Psychiatric:  Behavior: Behavior normal.     ED Results / Procedures / Treatments   Labs (all labs ordered are listed, but only abnormal results are displayed) Labs Reviewed  COMPREHENSIVE METABOLIC PANEL - Abnormal; Notable for the following components:      Result Value   Sodium 134 (*)    Glucose, Bld 302 (*)    Calcium 8.7 (*)    AST 64 (*)    ALT 54 (*)    All other components within normal limits  CBC WITH DIFFERENTIAL/PLATELET - Abnormal; Notable for the following components:   MCV 78.6 (*)    All other components within normal limits    EKG EKG Interpretation  Date/Time:  Wednesday March 11 2022 19:15:07 EST Ventricular Rate:  99 PR Interval:  130 QRS Duration: 74 QT Interval:  346 QTC Calculation: 444 R Axis:   103 Text Interpretation: Normal sinus rhythm  Rightward axis Cannot rule out Inferior infarct , age undetermined Cannot rule out Anterior infarct , age undetermined Abnormal ECG Confirmed by Quintella Reichert [phone]) on 03/12/2022 8:45:29 AM  Radiology DG Chest 2 View  Result Date: 03/11/2022 CLINICAL DATA:  Bleach exposure. EXAM: CHEST - 2 VIEW COMPARISON:  October 20, 2015 FINDINGS: The heart size and mediastinal contours are within normal limits. Low lung volumes are noted. Both lungs are clear. Radiopaque surgical clips are seen within the right upper quadrant. The visualized skeletal structures are unremarkable. IMPRESSION: No active cardiopulmonary disease. Electronically Signed   By: Virgina Norfolk M.D.   On: 03/11/2022 19:47     Procedures Procedures    Medications Ordered in ED Medications  metFORMIN (GLUCOPHAGE) tablet 500 mg (500 mg Oral Given 03/12/22 0528)  amLODipine (NORVASC) tablet 10 mg (10 mg Oral Given 03/12/22 0527)    And  irbesartan (AVAPRO) tablet 150 mg (150 mg Oral Given 03/12/22 0528)  alum & mag hydroxide-simeth (MAALOX/MYLANTA) 200-200-20 MG/5ML suspension 15 mL (15 mLs Oral Given 03/12/22 1740)    ED Course/ Medical Decision Making/ A&P                           Medical Decision Making Risk OTC drugs. Prescription drug management.   This patient presents to the ED for concern of ingestion, this involves an extensive number of treatment options, and is a complaint that carries with it a high risk of complications and morbidity.  The differential diagnosis includes intraoral irritation vs acute toxicity   Co morbidities that complicate the patient evaluation  HTN DM   Additional history obtained:  External records from outside source obtained and reviewed including baseline labs for comparison and trending; none available since 2021   Lab Tests:  I Ordered, and personally interpreted labs.  The pertinent results include:  Transaminitis (AST 64, ALT 54), hyperglycemia of 304 (no DKA). No  significant electrolyte derangements. Normal WBC count.   Imaging Studies ordered:  I ordered imaging studies including CXR  I independently visualized and interpreted imaging which showed no acute cardiopulmonary abnormality I agree with the radiologist interpretation   Cardiac Monitoring:  The patient was maintained on a cardiac monitor.  I personally viewed and interpreted the cardiac monitored which showed an underlying rhythm of: NSR   Medicines ordered and prescription drug management:  I ordered medication including Norvasc and Avapro for HTN, Metformin for hyperglycemia, Maalox for intraoral irritation  Reevaluation of the patient after these medicines showed that the patient improved I have reviewed the patients home  medicines and have made adjustments as needed   Test Considered:  VBG   Reevaluation:  After the interventions noted above, I reevaluated the patient and found that they have :improved   Social Determinants of Health:  Insured patient   Dispostion:  After consideration of the diagnostic results and the patients response to treatment, I feel that the patent would benefit from supportive care with respect to bleach ingestion. No acute findings or electrolyte derangements on work up; no concern for acute toxicity.  Patient instructed to restart his prescribed BP medications and have BP rechecked by his PCP. Also encouraged recheck of LFTs by his PCP. Return precautions discussed and provided. Patient discharged in stable condition with no unaddressed concerns.         Final Clinical Impression(s) / ED Diagnoses Final diagnoses:  Hyperglycemia  Hypertension not at goal  Transaminitis    Rx / DC Orders ED Discharge Orders     None         Antony Madura, PA-C 04/07/22 [id number]    Shon Baton, MD 04/07/22 [phone]

## 2022-04-11 ENCOUNTER — Other Ambulatory Visit: Payer: Self-pay

## 2022-04-11 ENCOUNTER — Emergency Department (HOSPITAL_COMMUNITY)
Admission: EM | Admit: 2022-04-11 | Discharge: 2022-04-11 | Disposition: A | Discharge status: Home / Self Care | Payer: 59 | Attending: Emergency Medicine | Admitting: Emergency Medicine

## 2022-04-11 ENCOUNTER — Emergency Department (HOSPITAL_COMMUNITY): Payer: 59

## 2022-04-11 DIAGNOSIS — Z7984 Long term (current) use of oral hypoglycemic drugs: Secondary | ICD-10-CM | POA: Insufficient documentation

## 2022-04-11 DIAGNOSIS — E1165 Type 2 diabetes mellitus with hyperglycemia: Secondary | ICD-10-CM | POA: Insufficient documentation

## 2022-04-11 DIAGNOSIS — R110 Nausea: Secondary | ICD-10-CM | POA: Insufficient documentation

## 2022-04-11 DIAGNOSIS — I10 Essential (primary) hypertension: Secondary | ICD-10-CM | POA: Insufficient documentation

## 2022-04-11 DIAGNOSIS — Z79899 Other long term (current) drug therapy: Secondary | ICD-10-CM | POA: Insufficient documentation

## 2022-04-11 DIAGNOSIS — R739 Hyperglycemia, unspecified: Secondary | ICD-10-CM

## 2022-04-11 LAB — COMPREHENSIVE METABOLIC PANEL
ALT: 70 U/L — ABNORMAL HIGH (ref 0–44)
AST: 46 U/L — ABNORMAL HIGH (ref 15–41)
Albumin: 3.7 g/dL (ref 3.5–5.0)
Alkaline Phosphatase: 64 U/L (ref 38–126)
Anion gap: 10 (ref 5–15)
BUN: 11 mg/dL (ref 6–20)
CO2: 24 mmol/L (ref 22–32)
Calcium: 9 mg/dL (ref 8.9–10.3)
Chloride: 100 mmol/L (ref 98–111)
Creatinine, Ser: 0.75 mg/dL (ref 0.61–1.24)
GFR, Estimated: >60 mL/min (ref >60)
Glucose, Bld: 189 mg/dL — ABNORMAL HIGH (ref 70–99)
Potassium: 3.6 mmol/L (ref 3.5–5.1)
Sodium: 134 mmol/L — ABNORMAL LOW (ref 135–145)
Total Bilirubin: 0.3 mg/dL (ref 0.3–1.2)
Total Protein: 7.4 g/dL (ref 6.5–8.1)

## 2022-04-11 LAB — CBC WITH DIFFERENTIAL/PLATELET
Abs Immature Granulocytes: 0.02 10*3/uL (ref 0.00–0.07)
Basophils Absolute: 0 10*3/uL (ref 0.0–0.1)
Basophils Relative: 1 %
Eosinophils Absolute: 0.7 10*3/uL — ABNORMAL HIGH (ref 0.0–0.5)
Eosinophils Relative: 9 %
HCT: 40.5 % (ref 39.0–52.0)
Hemoglobin: 13.2 g/dL (ref 13.0–17.0)
Immature Granulocytes: 0 %
Lymphocytes Relative: 38 %
Lymphs Abs: 2.9 10*3/uL (ref 0.7–4.0)
MCH: 26 pg (ref 26.0–34.0)
MCHC: 32.6 g/dL (ref 30.0–36.0)
MCV: 79.7 fL — ABNORMAL LOW (ref 80.0–100.0)
Monocytes Absolute: 0.7 10*3/uL (ref 0.1–1.0)
Monocytes Relative: 9 %
Neutro Abs: 3.4 10*3/uL (ref 1.7–7.7)
Neutrophils Relative %: 43 %
Platelets: 354 10*3/uL (ref 150–400)
RBC: 5.08 MIL/uL (ref 4.22–5.81)
RDW: 13.8 % (ref 11.5–15.5)
WBC: 7.8 10*3/uL (ref 4.0–10.5)
nRBC: 0 % (ref 0.0–0.2)

## 2022-04-11 LAB — URINALYSIS, ROUTINE W REFLEX MICROSCOPIC
Bacteria, UA: NONE SEEN
Bilirubin Urine: NEGATIVE
Glucose, UA: 50 mg/dL — AB
Ketones, ur: NEGATIVE mg/dL
Leukocytes,Ua: NEGATIVE
Nitrite: NEGATIVE
Protein, ur: NEGATIVE mg/dL
Specific Gravity, Urine: 1.006 (ref 1.005–1.030)
pH: 6 (ref 5.0–8.0)

## 2022-04-11 LAB — CBG MONITORING, ED: Glucose-Capillary: 163 mg/dL — ABNORMAL HIGH (ref 70–99)

## 2022-04-11 LAB — LIPASE, BLOOD: Lipase: 38 U/L (ref 11–51)

## 2022-04-11 LAB — TROPONIN I (HIGH SENSITIVITY): Troponin I (High Sensitivity): 8 ng/L (ref <18)

## 2022-04-11 MED ORDER — AMLODIPINE BESYLATE 10 MG PO TABS: 10.0000 mg | ORAL_TABLET | Freq: Every day | ORAL | 2 refills | Status: DC

## 2022-04-11 MED ORDER — METFORMIN HCL 500 MG PO TABS: 500.0000 mg | ORAL_TABLET | Freq: Two times a day (BID) | ORAL | 0 refills | Status: DC

## 2022-04-11 NOTE — ED Provider Notes (Signed)
[facility] DEPT Provider Note   CSN: [id number] Arrival date & time: 04/11/22  1340     History  Chief Complaint  Patient presents with   Abdominal Pain    Ian Moreno is a 47 y.o. male presenting to emergency department with several complaints.  The patient does have a history of hypertension and diabetes, says he does not have a primary care doctor because he is uninsured and cannot afford to see 1 for a long time.  He had been prescribed blood pressure medicine several months ago through urgent care but was not able to afford continuing to fill this prescription due to out-of-pocket cost.  Therefore is not taking any medications.  He reports that today while at work he began having a tingling sensation in his extremities and lightheadedness along with some nausea.  No specific chest pressure or pain reported.  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  No history of DVT or PE.  He does vape nicotine while working.  Since arriving in the ED feels significantly better and feels that his symptoms have nearly all resolved.  HPI     Home Medications Prior to Admission medications   Medication Sig Start Date End Date Taking? Authorizing Provider  amLODipine (NORVASC) 10 MG tablet Take 1 tablet (10 mg total) by mouth daily. 04/11/22 05/11/22 Yes Ovella Manygoats, Carola Rhine, MD  metFORMIN (GLUCOPHAGE) 500 MG tablet Take 1 tablet (500 mg total) by mouth 2 (two) times daily with a meal. 04/11/22 05/11/22 Yes Jenisis Harmsen, Carola Rhine, MD  acetaminophen (TYLENOL) 500 MG tablet Take 1,000 mg by mouth every 6 (six) hours as needed for moderate pain.  Patient not taking: Reported on 04/15/2021    [provider]  amlodipine-olmesartan (AZOR) 10-20 MG tablet Take 1 tablet by mouth daily. 04/15/21   Vanessa Kick, MD  ibuprofen (ADVIL) 200 MG tablet Take 400 mg by mouth every 6 (six) hours as needed for moderate pain. Patient not taking: Reported on 04/15/2021     [provider]  metFORMIN (GLUCOPHAGE) 500 MG tablet Take 1 tablet (500 mg total) by mouth 2 (two) times daily with a meal. 04/15/21   Vanessa Kick, MD  tiZANidine (ZANAFLEX) 2 MG tablet Take 2 mg by mouth every 6 (six) hours as needed for muscle spasms. Patient not taking: Reported on 04/15/2021    [provider]      Allergies    Patient has no known allergies.    Review of Systems   Review of Systems  Physical Exam Updated Vital Signs BP (!) 171/110   Pulse 80   Temp 97.8 F (36.6 C) (Oral)   Resp (!) 22   Ht 6\' 1"  (1.854 m)   Wt 79.4 kg   SpO2 100%   BMI 23.09 kg/m  Physical Exam Constitutional:      General: He is not in acute distress. HENT:     Head: Normocephalic and atraumatic.  Eyes:     Conjunctiva/sclera: Conjunctivae normal.     Pupils: Pupils are equal, round, and reactive to light.  Cardiovascular:     Rate and Rhythm: Normal rate and regular rhythm.  Pulmonary:     Effort: Pulmonary effort is normal. No respiratory distress.  Abdominal:     General: There is no distension.     Tenderness: There is no abdominal tenderness.  Skin:    General: Skin is warm and dry.  Neurological:     General:  No focal deficit present.     Mental Status: He is alert and oriented to person, place, and time. Mental status is at baseline.     Cranial Nerves: No cranial nerve deficit.     Motor: No weakness.  Psychiatric:        Mood and Affect: Mood normal.        Behavior: Behavior normal.     ED Results / Procedures / Treatments   Labs (all labs ordered are listed, but only abnormal results are displayed) Labs Reviewed  COMPREHENSIVE METABOLIC PANEL - Abnormal; Notable for the following components:      Result Value   Sodium 134 (*)    Glucose, Bld 189 (*)    AST 46 (*)    ALT 70 (*)    All other components within normal limits  CBC WITH DIFFERENTIAL/PLATELET - Abnormal; Notable for the following components:   MCV 79.7 (*)    Eosinophils  Absolute 0.7 (*)    All other components within normal limits  URINALYSIS, ROUTINE W REFLEX MICROSCOPIC - Abnormal; Notable for the following components:   Color, Urine STRAW (*)    Glucose, UA 50 (*)    Hgb urine dipstick SMALL (*)    All other components within normal limits  CBG MONITORING, ED - Abnormal; Notable for the following components:   Glucose-Capillary 163 (*)    All other components within normal limits  LIPASE, BLOOD  TROPONIN I (HIGH SENSITIVITY)    EKG EKG Interpretation  Date/Time:  Saturday April 11 2022 14:14:01 EST Ventricular Rate:  89 PR Interval:  134 QRS Duration: 87 QT Interval:  386 QTC Calculation: 470 R Axis:   60 Text Interpretation: Sinus rhythm Probable inferior infarct, age indeterminate Baseline wander in lead(s) I II aVR No sig changes from Mar 11 2022 tracing Confirmed by Octaviano Glow [phone]) on 04/11/2022 3:01:57 PM  Radiology DG Chest 1 View  Result Date: 04/11/2022 CLINICAL DATA:  Abdominal pain and discomfort. Patient taking Ozempic. EXAM: CHEST  1 VIEW COMPARISON:  None Available. FINDINGS: The heart size and mediastinal contours are within normal limits. Both lungs are clear. The visualized skeletal structures are unremarkable. IMPRESSION: No active disease. Electronically Signed   By: Dorise Bullion III M.D.   On: 04/11/2022 14:31   CT Head Wo Contrast  Result Date: 04/11/2022 CLINICAL DATA:  Headaches EXAM: CT HEAD WITHOUT CONTRAST TECHNIQUE: Contiguous axial images were obtained from the base of the skull through the vertex without intravenous contrast. RADIATION DOSE REDUCTION: This exam was performed according to the departmental dose-optimization program which includes automated exposure control, adjustment of the mA and/or kV according to patient size and/or use of iterative reconstruction technique. COMPARISON:  02/15/2020 FINDINGS: Brain: No acute intracranial findings are seen. There are no signs of bleeding within the cranium.  Ventricles are not dilated. There is no focal edema or mass effect. Vascular: Unremarkable. Skull: Unremarkable. Sinuses/Orbits: There is mucosal thickening in ethmoid sinus. Small air-fluid level is seen in right maxillary sinus. Other: None. IMPRESSION: No acute intracranial findings are seen in noncontrast CT brain. Chronic ethmoid sinusitis. There is air-fluid level in right maxillary sinus suggesting possible acute sinusitis. Electronically Signed   By: Elmer Picker M.D.   On: 04/11/2022 14:17    Procedures Procedures    Medications Ordered in ED Medications - No data to display  ED Course/ Medical Decision Making/ A&P  Medical Decision Making Amount and/or Complexity of Data Reviewed Labs: ordered.  Risk Prescription drug management.   This patient presents to the ED with concern for constellation of symptoms as noted above. This involves an extensive number of treatment options, and is a complaint that carries with it a high risk of complications and morbidity.  The differential diagnosis includes viral illness for symptomatic hypertension versus metabolic derangement versus anemia versus atypical ACS versus other  Co-morbidities that complicate the patient evaluation: Cardiovascular risk factors include uncontrolled hypertension and borderline diabetes  I ordered and personally interpreted labs.  The pertinent results include: Troponin unremarkable.  Patient has some mild hyperglycemia with glucose 189.  No leukocytosis.  No acute anemia.  UA has hemoglobin and glucose but no evidence of infection.  Lipase within normal limits.  CT scan of the brain and x-ray of the chest was ordered from triage I independently visualized and interpreted imaging which showed no acute infarct or abnormalities noted I agree with the radiologist interpretation  The patient was maintained on a cardiac monitor.  I personally viewed and interpreted the cardiac monitored  which showed an underlying rhythm of: Sinus rhythm  Per my interpretation the patient's ECG shows sinus rhythm with evidence of left ventricular hypertrophy, without significant changes from prior tracing  I have reviewed the patients home medicines and have made adjustments as needed.  I will briefly refill his prescription until he can establish care at the wellness center or with a new PCP, which she tells me he is able to do at this time.  Test Considered: with PERC score of 0, I do lower clinical suspicion for acute PE in this setting, and did not feel that we need a CT angiogram or D-dimer testing at this time.  However given the patient's profession, we did discuss the importance of regularly taking breaks to stretch his muscles, at least once every 2 hours.   After the interventions noted above, I reevaluated the patient and found that they have: improved  Social Determinants of Health: social work consult to assist with a new primary care provider appointment.  I represcribed the patient's blood pressure medications which I believe will be more affordable as these are generic.  I counseled the patient about the dangers of smoking and we discussed how to manage high blood pressure.  Nicotine and salty foods can both be risk factors for hypertension and cardiovascular disease and we discussed this at length.  Dispostion:  After consideration of the diagnostic results and the patients response to treatment, I feel that the patent would benefit from outpatient follow-up.         Final Clinical Impression(s) / ED Diagnoses Final diagnoses:  Uncontrolled hypertension  Nausea  Hyperglycemia    Rx / DC Orders ED Discharge Orders          Ordered    metFORMIN (GLUCOPHAGE) 500 MG tablet  2 times daily with meals        04/11/22 1537    amLODipine (NORVASC) 10 MG tablet  Daily        04/11/22 1537              Terald Sleeper, MD 04/11/22 1539

## 2022-04-11 NOTE — Discharge Instructions (Addendum)
I prescribed you a blood pressure medicine called amlodipine.  You will take this once daily to help with blood pressure.  It is very important that you establish care with a primary care clinic.  Your blood pressure, diabetes, blood sugar, and other medical issues should be managed by primary care clinic, not the emergency room.  Please make every effort to quit smoking.  Smoking and salty foods, such as fast food, can raise your blood pressure.  High blood pressure can put you at risk for serious medical conditions and stroke.

## 2022-04-11 NOTE — ED Provider Triage Note (Signed)
Emergency Medicine Provider Triage Evaluation Note  Joshus Sacra , a 47 y.o. male  was evaluated in triage.  Pt complains of chest pain, shortness of breath, high blood pressure, headache.  Patient reports symptoms beginning earlier today.  States he was seen emesis, with high blood sugar as well as high blood pressure but was unable to afford antihypertensive medication provided.  Denies visual disturbance, gait abnormality, weakness/sensory deficits in upper or lower extremities, slurred speech, facial droop.  Denies fever, chills, night sweats, nausea, vomiting, urinary symptoms, change in bowel habits..  Review of Systems  Positive: See above Negative:   Physical Exam  BP (!) 199/111   Pulse 91   Temp 97.8 F (36.6 C) (Oral)   Resp 18   Ht 6\' 1"  (1.854 m)   Wt 79.4 kg   SpO2 99%   BMI 23.09 kg/m  Gen:   Awake, no distress   Resp:  Normal effort  MSK:   Moves extremities without difficulty  Other:  No abdominal tenderness.  No lower extremity edema appreciated.  Cranial nerves II through XII grossly intact.  Medical Decision Making  Medically screening exam initiated at 1:59 PM.  Appropriate orders placed.  Ugochukwu Matteucci was informed that the remainder of the evaluation will be completed by another provider, this initial triage assessment does not replace that evaluation, and the importance of remaining in the ED until their evaluation is complete.     Wilnette Kales, [place] 04/11/22 1401

## 2022-04-11 NOTE — ED Triage Notes (Signed)
Patient reports taking metformin last night (has been on this for about 3 years), started to have abdominal pain. Also feels short of breath, this started today. Has not tried anything for pain, has not taken BP meds for about 2 weeks, he is out.

## 2022-07-28 ENCOUNTER — Other Ambulatory Visit: Payer: Self-pay

## 2022-07-28 ENCOUNTER — Emergency Department (HOSPITAL_COMMUNITY)
Admission: EM | Admit: 2022-07-28 | Discharge: 2022-07-29 | Disposition: A | Discharge status: Home / Self Care | Payer: Self-pay | Attending: Emergency Medicine | Admitting: Emergency Medicine

## 2022-07-28 ENCOUNTER — Encounter (HOSPITAL_COMMUNITY): Payer: Self-pay

## 2022-07-28 DIAGNOSIS — Z7984 Long term (current) use of oral hypoglycemic drugs: Secondary | ICD-10-CM | POA: Insufficient documentation

## 2022-07-28 DIAGNOSIS — I10 Essential (primary) hypertension: Secondary | ICD-10-CM | POA: Insufficient documentation

## 2022-07-28 DIAGNOSIS — E119 Type 2 diabetes mellitus without complications: Secondary | ICD-10-CM | POA: Insufficient documentation

## 2022-07-28 DIAGNOSIS — R030 Elevated blood-pressure reading, without diagnosis of hypertension: Secondary | ICD-10-CM

## 2022-07-28 DIAGNOSIS — Z79899 Other long term (current) drug therapy: Secondary | ICD-10-CM | POA: Insufficient documentation

## 2022-07-28 DIAGNOSIS — R109 Unspecified abdominal pain: Secondary | ICD-10-CM

## 2022-07-28 NOTE — ED Triage Notes (Signed)
Patient reports intermittent right sided flank pain x1 week. Reports he has been taking meds at home with relief and pain is worse when he drives long distances. Denies any urinary symptoms, denies n/v/d.

## 2022-07-29 ENCOUNTER — Emergency Department (HOSPITAL_COMMUNITY): Payer: Self-pay

## 2022-07-29 ENCOUNTER — Encounter (HOSPITAL_COMMUNITY): Payer: Self-pay | Admitting: Radiology

## 2022-07-29 LAB — URINALYSIS, ROUTINE W REFLEX MICROSCOPIC
Bilirubin Urine: NEGATIVE
Glucose, UA: 50 mg/dL — AB
Hgb urine dipstick: NEGATIVE
Ketones, ur: NEGATIVE mg/dL
Leukocytes,Ua: NEGATIVE
Nitrite: NEGATIVE
Protein, ur: NEGATIVE mg/dL
Specific Gravity, Urine: 1.023 (ref 1.005–1.030)
pH: 6 (ref 5.0–8.0)

## 2022-07-29 LAB — CBC
HCT: 42.6 % (ref 39.0–52.0)
Hemoglobin: 13.8 g/dL (ref 13.0–17.0)
MCH: 26.3 pg (ref 26.0–34.0)
MCHC: 32.4 g/dL (ref 30.0–36.0)
MCV: 81.3 fL (ref 80.0–100.0)
Platelets: 318 10*3/uL (ref 150–400)
RBC: 5.24 MIL/uL (ref 4.22–5.81)
RDW: 13.8 % (ref 11.5–15.5)
WBC: 8.1 10*3/uL (ref 4.0–10.5)
nRBC: 0 % (ref 0.0–0.2)

## 2022-07-29 LAB — BASIC METABOLIC PANEL
Anion gap: 8 (ref 5–15)
BUN: 19 mg/dL (ref 6–20)
CO2: 25 mmol/L (ref 22–32)
Calcium: 8.9 mg/dL (ref 8.9–10.3)
Chloride: 98 mmol/L (ref 98–111)
Creatinine, Ser: 0.77 mg/dL (ref 0.61–1.24)
GFR, Estimated: >60 mL/min (ref >60)
Glucose, Bld: 213 mg/dL — ABNORMAL HIGH (ref 70–99)
Potassium: 3.8 mmol/L (ref 3.5–5.1)
Sodium: 131 mmol/L — ABNORMAL LOW (ref 135–145)

## 2022-07-29 LAB — CBG MONITORING, ED
Glucose-Capillary: 220 mg/dL — ABNORMAL HIGH (ref 70–99)
Glucose-Capillary: 179 mg/dL — ABNORMAL HIGH (ref 70–99)

## 2022-07-29 MED ORDER — METFORMIN HCL 500 MG PO TABS: 500.0000 mg | ORAL_TABLET | Freq: Two times a day (BID) | ORAL | 2 refills | Status: DC

## 2022-07-29 MED ORDER — IBUPROFEN 200 MG PO TABS
600.0000 mg | ORAL_TABLET | Freq: Once | ORAL | Status: AC
  Administered 2022-07-29: 600 mg via ORAL
  Filled 2022-07-29: qty 3

## 2022-07-29 MED ORDER — METFORMIN HCL 500 MG PO TABS: 500.0000 mg | ORAL_TABLET | Freq: Two times a day (BID) | ORAL | 2 refills | Status: AC

## 2022-07-29 MED ORDER — AMLODIPINE BESYLATE 5 MG PO TABS
10.0000 mg | ORAL_TABLET | Freq: Once | ORAL | Status: AC
  Administered 2022-07-29: 10 mg via ORAL
  Filled 2022-07-29: qty 2

## 2022-07-29 MED ORDER — AMLODIPINE BESYLATE 10 MG PO TABS: 10.0000 mg | ORAL_TABLET | Freq: Every day | ORAL | 0 refills | Status: DC

## 2022-07-29 MED ORDER — AMLODIPINE BESYLATE 5 MG PO TABS: 10.0000 mg | ORAL_TABLET | Freq: Every day | ORAL | Status: DC

## 2022-07-29 MED ORDER — AMLODIPINE BESYLATE 10 MG PO TABS: 10.0000 mg | ORAL_TABLET | Freq: Every day | ORAL | 0 refills | Status: AC

## 2022-07-29 NOTE — Discharge Instructions (Signed)
Evaluation for your flank pain today was overall reassuring.  CT scan was negative for kidney stone or any other acute findings.  Recommend you follow-up with your PCP for ongoing flank pain.  Also sent metformin and amlodipine to your pharmacy.  I have also reached out to our transitions of care team on your behalf who will contact you by phone at some point to discuss more affordable options for your medications at home.  If you start to have blood in the urine, painful urination, malodorous urine, new fever or any other concerning symptom please return emergency department further evaluation.

## 2022-07-29 NOTE — ED Provider Notes (Signed)
[facility] EMERGENCY DEPARTMENT AT [facility] Provider Note   CSN: [id number] Arrival date & time: 07/28/22  2331     History  Chief Complaint  Patient presents with   Flank Pain   HPI Ian Moreno is a 47 y.o. male with hypertension, diabetes presenting for right flank pain.  Started about a week ago.  Pain located in the right flank and extends to the right abdomen and down to the right groin.  Denies urinary symptoms.  Denies nausea vomiting diarrhea.  Denies recent trauma.  Denies fever and chills.  Patient also mention that he does not have medical insurance at this time and and has run out of his amlodipine and metformin.   Flank Pain       Home Medications Prior to Admission medications   Medication Sig Start Date End Date Taking? Authorizing Provider  amLODipine (NORVASC) 10 MG tablet Take 1 tablet (10 mg total) by mouth daily for 30 doses. 07/29/22 08/28/22 Yes Gareth Eagle, PA-C  acetaminophen (TYLENOL) 500 MG tablet Take 1,000 mg by mouth every 6 (six) hours as needed for moderate pain.  Patient not taking: Reported on 04/15/2021    [provider]  amlodipine-olmesartan (AZOR) 10-20 MG tablet Take 1 tablet by mouth daily. 04/15/21   Mardella Layman, MD  ibuprofen (ADVIL) 200 MG tablet Take 400 mg by mouth every 6 (six) hours as needed for moderate pain. Patient not taking: Reported on 04/15/2021    [provider]  metFORMIN (GLUCOPHAGE) 500 MG tablet Take 1 tablet (500 mg total) by mouth 2 (two) times daily with a meal. 07/29/22   Gareth Eagle, PA-C  tiZANidine (ZANAFLEX) 2 MG tablet Take 2 mg by mouth every 6 (six) hours as needed for muscle spasms. Patient not taking: Reported on 04/15/2021    [provider]      Allergies    Patient has no known allergies.    Review of Systems   Review of Systems  Genitourinary:  Positive for flank pain.    Physical Exam   Vitals:   07/29/22 0130 07/29/22 0257  BP: (!) 182/106  (!) 181/109  Pulse: 71 65  Resp: 20 20  Temp: 97.6 F (36.4 C)   SpO2: 100% 100%    CONSTITUTIONAL:  well-appearing, NAD NEURO:  Alert and oriented x 3, CN 3-12 grossly intact EYES:  eyes equal and reactive ENT/NECK:  Supple, no stridor  CARDIO:  regular rate and rhythm, appears well-perfused  PULM:  No respiratory distress, CTAB GI/GU:  non-distended, soft, right flank tenderness MSK/SPINE:  No gross deformities, no edema, moves all extremities  SKIN:  no rash, atraumatic   *Additional and/or pertinent findings included in MDM below    ED Results / Procedures / Treatments   Labs (all labs ordered are listed, but only abnormal results are displayed) Labs Reviewed  URINALYSIS, ROUTINE W REFLEX MICROSCOPIC - Abnormal; Notable for the following components:      Result Value   Glucose, UA 50 (*)    All other components within normal limits  BASIC METABOLIC PANEL - Abnormal; Notable for the following components:   Sodium 131 (*)    Glucose, Bld 213 (*)    All other components within normal limits  CBG MONITORING, ED - Abnormal; Notable for the following components:   Glucose-Capillary 220 (*)    All other components within normal limits  CBG MONITORING, ED - Abnormal; Notable for the following components:   Glucose-Capillary 179 (*)  All other components within normal limits  CBC    EKG None  Radiology CT Renal Stone Study  Result Date: 07/29/2022 CLINICAL DATA:  Right flank pain, right lower quadrant abdominal pain EXAM: CT ABDOMEN AND PELVIS WITHOUT CONTRAST TECHNIQUE: Multidetector CT imaging of the abdomen and pelvis was performed following the standard protocol without IV contrast. RADIATION DOSE REDUCTION: This exam was performed according to the departmental dose-optimization program which includes automated exposure control, adjustment of the mA and/or kV according to patient size and/or use of iterative reconstruction technique. COMPARISON:  None Available.  FINDINGS: Lower chest: No acute abnormality. Hepatobiliary: No focal liver abnormality is seen. Status post cholecystectomy. No biliary dilatation. Pancreas: Unremarkable Spleen: Unremarkable Adrenals/Urinary Tract: 2.8 cm right adrenal nodule demonstrates homogeneous hypodensity measuring -5 Hounsfield units in keeping with a benign adrenal adenoma. No follow-up imaging is recommended for this lesion. The left adrenal gland is unremarkable. Kidneys are unremarkable. Bladder is mildly circumferentially thick walled likely related to decompression. Stomach/Bowel: Moderate transverse and descending colonic diverticulosis. The stomach, small bowel, and large bowel are otherwise unremarkable. Appendix normal. No free intraperitoneal gas or fluid. Vascular/Lymphatic: Aortic atherosclerosis. No enlarged abdominal or pelvic lymph nodes. Reproductive: Prostate is unremarkable. Other: Tiny fat containing umbilical hernia. Musculoskeletal: No acute or significant osseous findings. IMPRESSION: 1. No acute intra-abdominal pathology identified. No definite radiographic explanation for the patient's reported symptoms. 2. 2.8 cm benign right adrenal adenoma. No follow-up imaging is recommended for this lesion. 3. Moderate colonic diverticulosis without superimposed acute inflammatory change. 4. Aortic atherosclerosis. Aortic Atherosclerosis (ICD10-I70.0). Electronically Signed   By: Helyn Numbers M.D.   On: 07/29/2022 02:09    Procedures Procedures    Medications Ordered in ED Medications  ibuprofen (ADVIL) tablet 600 mg (600 mg Oral Given 07/29/22 0250)  amLODipine (NORVASC) tablet 10 mg (10 mg Oral Given 07/29/22 0250)    ED Course/ Medical Decision Making/ A&P Clinical Course as of 07/29/22 0351  Wed Jul 29, 2022  0132 Glucose(!): 213 [JR]    Clinical Course User Index [JR] Gareth Eagle, PA-C                             Medical Decision Making Amount and/or Complexity of Data Reviewed Labs: ordered.  Decision-making details documented in ED Course.  Risk OTC drugs.   Initial Impression and Ddx 47 year old well-appearing male presenting for right-sided flank pain.  Exam notable for right flank tenderness.  DDx includes pyelonephritis, nephrolithiasis, acute cholecystitis, other intra-abdominal infection, pneumothorax, and soft tissue injury. Patient PMH that increases complexity of ED encounter: Hypertension and diabetes  Interpretation of Diagnostics I independent reviewed and interpreted the labs as followed: Hyperglycemic and hyponatremic  - I independently visualized the following imaging with scope of interpretation limited to determining acute life threatening conditions related to emergency care: CT renal stone, which revealed benign adrenal adenoma, colonic diverticulosis but no other acute findings.  Patient Reassessment and Ultimate Disposition/Management Initial concern was pyelonephritis versus nephrolithiasis but unlikely given negative CT scan.  Treated pain with ibuprofen.  Patient persistently hypertensive, ordered his home dose of amlodipine and sent a refill to his pharmacy.  Also reordered his metformin and sent that to his pharmacy.  Also consulted TOC with the goal for them to reach out to him to discuss more affordable options for his home medications.  Discharged with stable vitals.  Discussed return precautions.  Advised to follow-up with his PCP.  Patient management required  discussion with the following services or consulting groups:  None  Complexity of Problems Addressed Acute complicated illness or Injury  Additional Data Reviewed and Analyzed Further history obtained from: Past medical history and medications listed in the EMR and Prior ED visit notes  Patient Encounter Risk Assessment Prescriptions         Final Clinical Impression(s) / ED Diagnoses Final diagnoses:  Flank pain  Elevated blood pressure reading    Rx / DC Orders ED Discharge  Orders          Ordered    metFORMIN (GLUCOPHAGE) 500 MG tablet  2 times daily with meals,   Status:  Discontinued        07/29/22 0348    amLODipine (NORVASC) 10 MG tablet  Daily,   Status:  Discontinued        07/29/22 0348    amLODipine (NORVASC) 10 MG tablet  Daily,   Status:  Discontinued        07/29/22 0350    metFORMIN (GLUCOPHAGE) 500 MG tablet  2 times daily with meals        07/29/22 0350    amLODipine (NORVASC) 10 MG tablet  Daily        07/29/22 0351              Gareth Eagle, PA-C 07/29/22 0352    Glendora Score, MD 07/29/22 1757

## 2022-10-09 IMAGING — CT CT HEAD W/O CM
3 series · 15 of 47 positions shown, 18 images · non-contrast
Comparison: None.

CLINICAL DATA: 44-year-old presenting with dizziness and
hypertension since the patient received his second JNOT3-XP vaccine
on 01/31/2020.

EXAM:
CT HEAD WITHOUT CONTRAST
TECHNIQUE: Contiguous axial images were obtained from the base of the skull
through the vertex without intravenous contrast.

[Series 2: head wo · axial · 0.41mm/px · z∈[-106,+18]mm · 9 of 30 slices shown, 12 images]
[im 3/30  brain]
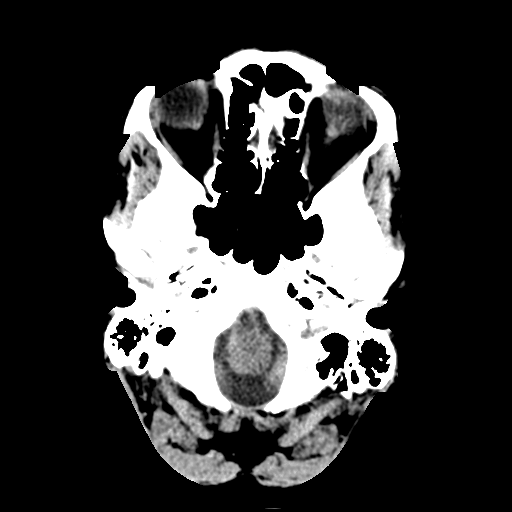
[im 3/30  bone]
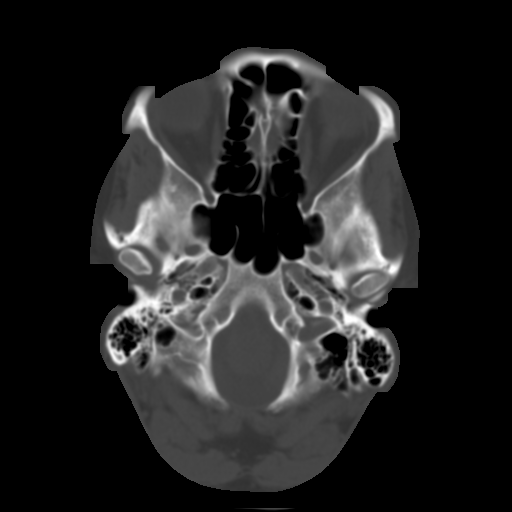
[im 6/30  brain]
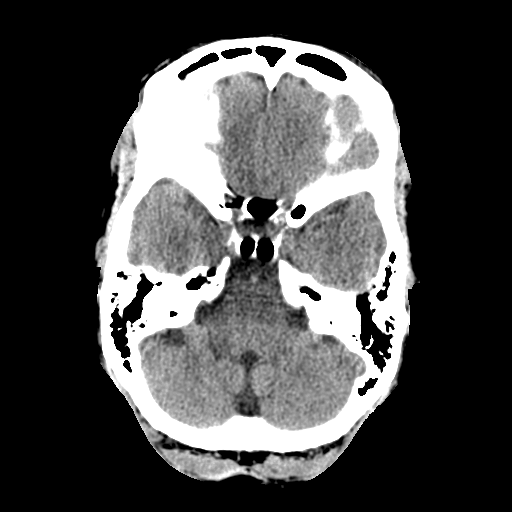
[im 9/30  brain]
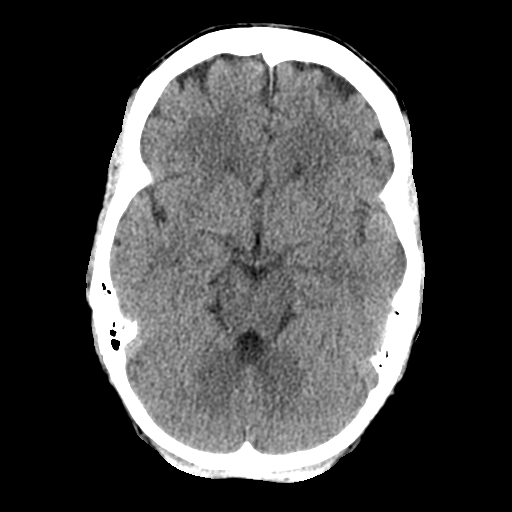
[im 12/30  brain]
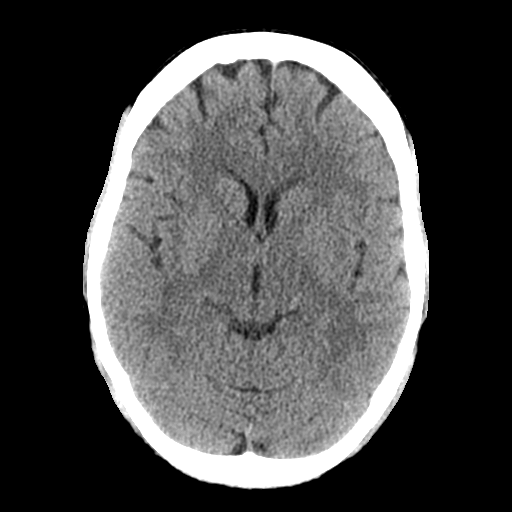
[im 16/30  brain]
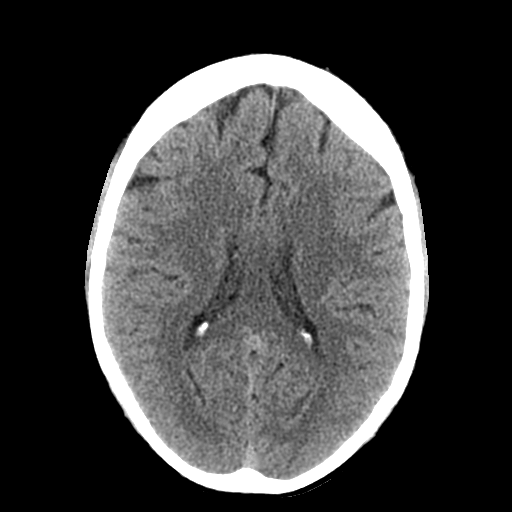
[im 16/30  bone]
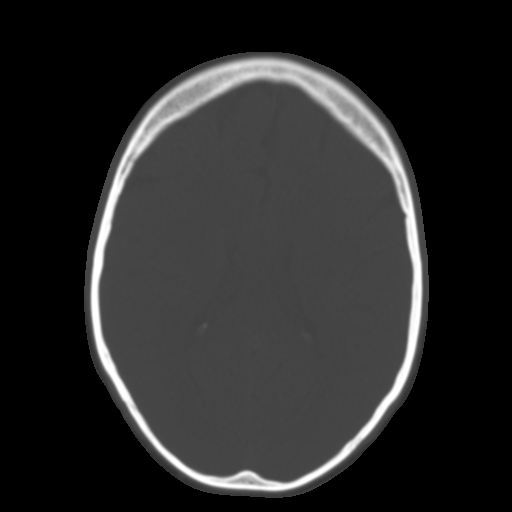
[im 19/30  brain]
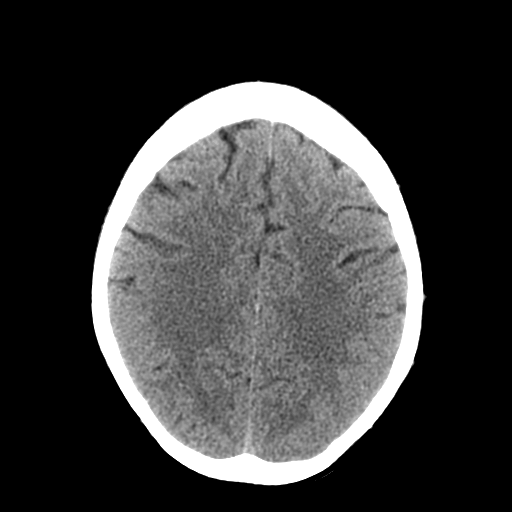
[im 22/30  brain]
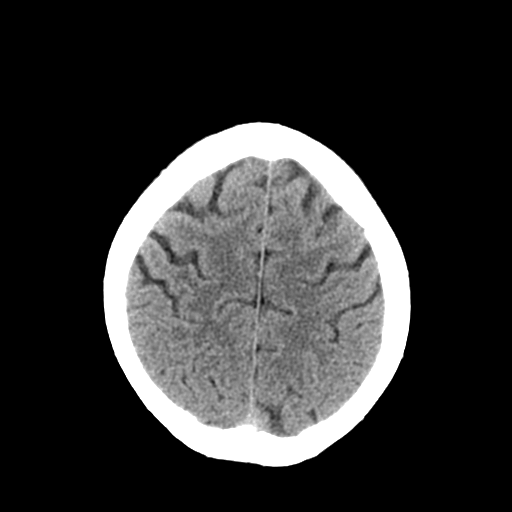
[im 25/30  brain]
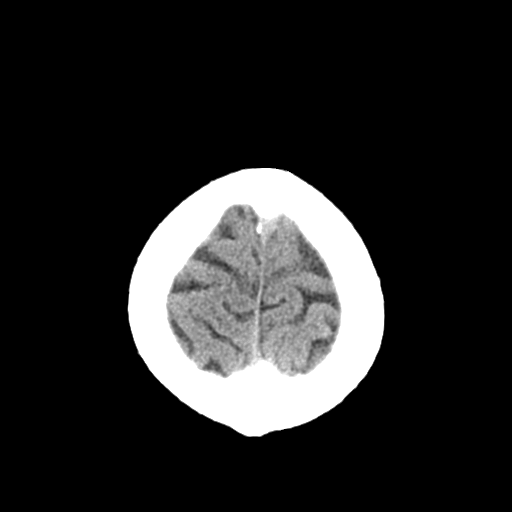
[im 28/30  brain]
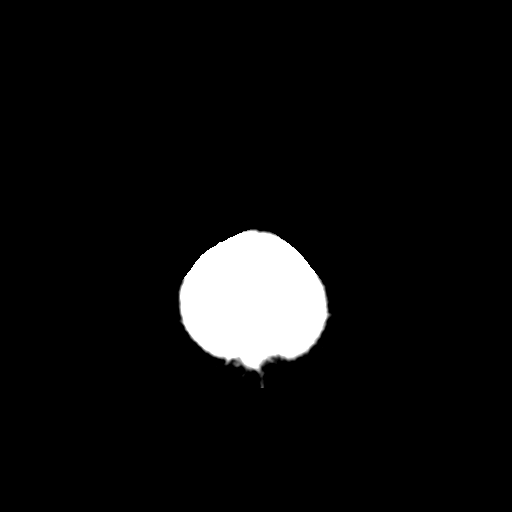
[im 28/30  bone]
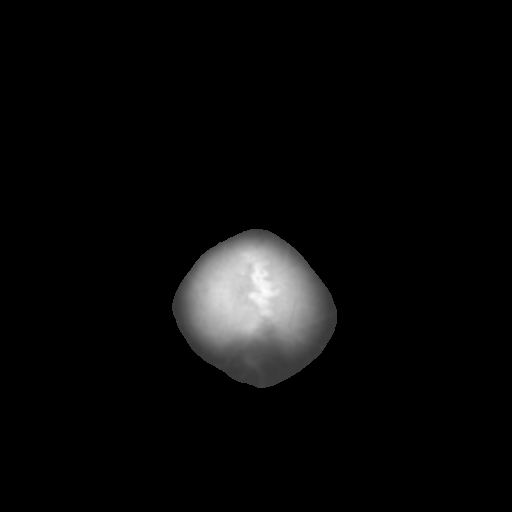

[Series 5: coronal soft tissue · coronal · 0.29mm/px · 3 of 71 slices shown]
[im 24/71  brain]
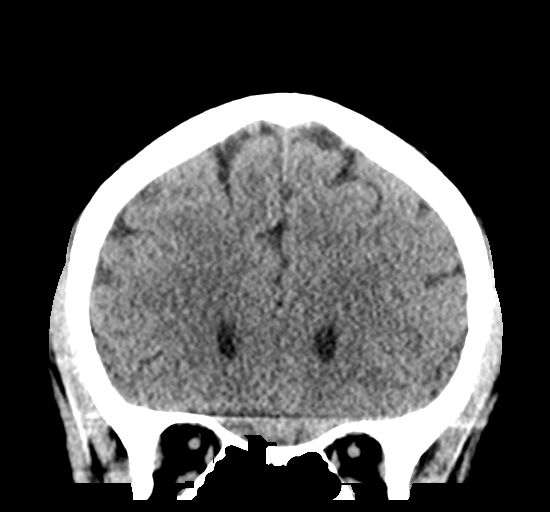
[im 32/71  brain]
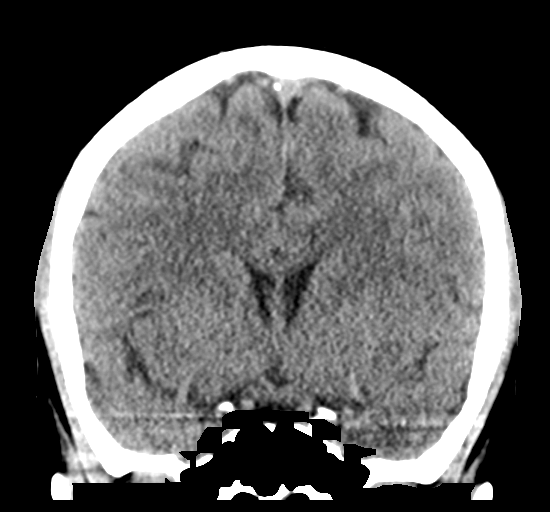
[im 39/71  brain]
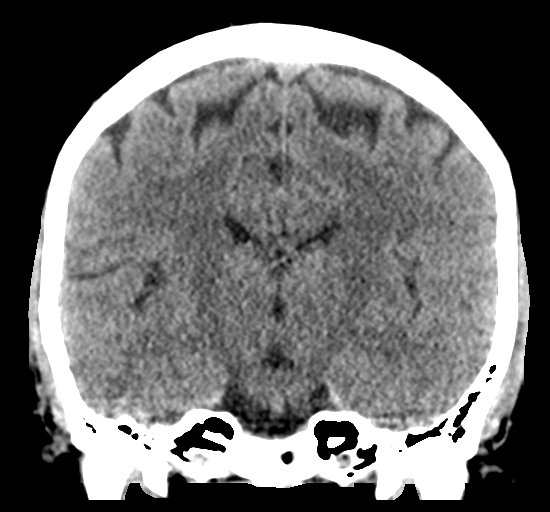

[Series 6: sagittal soft tissue · sagittal · 0.29mm/px · 3 of 54 slices shown]
[im 18/54  brain]
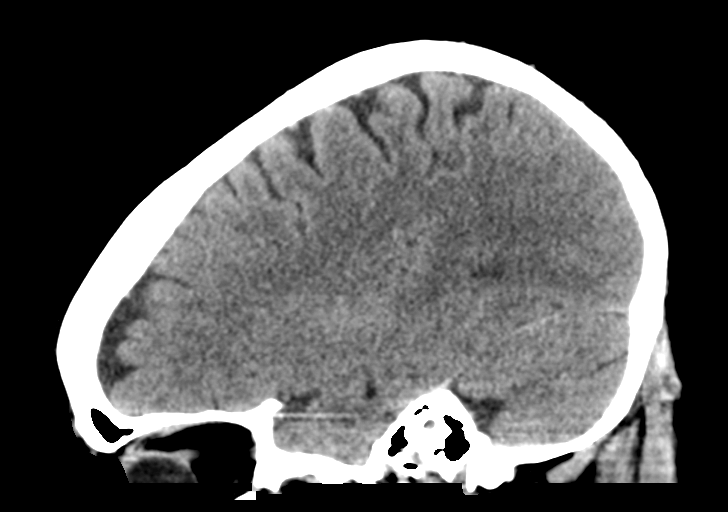
[im 27/54  brain]
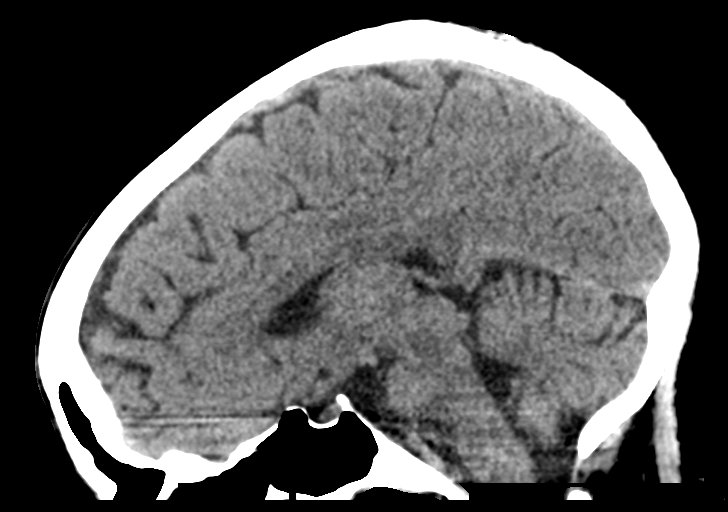
[im 36/54  brain]
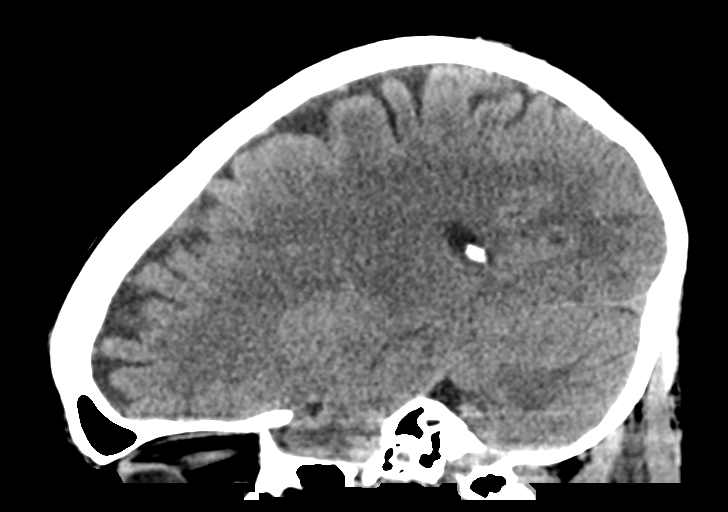

[15 of 47 positions shown; findings below may reference images not displayed]

FINDINGS: Brain: Ventricular system normal in size and appearance for age. No
mass lesion. No midline shift. No acute hemorrhage or hematoma. No
extra-axial fluid collections. No evidence of acute infarction. No
focal brain parenchymal abnormalities.

Vascular: No hyperdense vessel.  No visible atherosclerosis.

Skull: No skull fracture or other focal osseous abnormality
involving the skull.

Sinuses/Orbits: Visualized paranasal sinuses, bilateral mastoid air
cells and bilateral middle ear cavities well-aerated. Visualized
orbits and globes normal in appearance.

Other: None.
IMPRESSION: Normal examination.
